# Patient Record
Sex: Female | Born: 1960
Health system: Southern US, Community
[De-identification: ages and names within clinical notes are randomized; demographics above are authoritative.]

## PROBLEM LIST (undated history)

## (undated) DIAGNOSIS — N809 Endometriosis, unspecified: Secondary | ICD-10-CM

## (undated) DIAGNOSIS — N870 Mild cervical dysplasia: Secondary | ICD-10-CM

## (undated) DIAGNOSIS — N841 Polyp of cervix uteri: Secondary | ICD-10-CM

## (undated) HISTORY — PX: HYSTEROSCOPY: SHX211

## (undated) HISTORY — DX: Polyp of cervix uteri: N84.1

## (undated) HISTORY — PX: FACIAL RECONSTRUCTION SURGERY: SHX631

## (undated) HISTORY — DX: Mild cervical dysplasia: N87.0

## (undated) HISTORY — DX: Endometriosis, unspecified: N80.9

## (undated) HISTORY — PX: TONSILLECTOMY: SUR1361

## (undated) HISTORY — PX: ROOT CANAL: SHX2363

## (undated) HISTORY — PX: COLPOSCOPY: SHX161

---

## 1993-10-08 DIAGNOSIS — N870 Mild cervical dysplasia: Secondary | ICD-10-CM

## 1993-10-08 HISTORY — DX: Mild cervical dysplasia: N87.0

## 1998-07-27 ENCOUNTER — Other Ambulatory Visit: Admission: RE | Admit: 1998-07-27 | Discharge: 1998-07-27 | Payer: Self-pay | Admitting: Obstetrics and Gynecology

## 1999-06-26 ENCOUNTER — Other Ambulatory Visit: Admission: RE | Admit: 1999-06-26 | Discharge: 1999-06-26 | Payer: Self-pay | Admitting: Obstetrics and Gynecology

## 2000-08-12 ENCOUNTER — Other Ambulatory Visit: Admission: RE | Admit: 2000-08-12 | Discharge: 2000-08-12 | Payer: Self-pay | Admitting: Obstetrics and Gynecology

## 2001-09-02 ENCOUNTER — Other Ambulatory Visit: Admission: RE | Admit: 2001-09-02 | Discharge: 2001-09-02 | Payer: Self-pay | Admitting: Obstetrics and Gynecology

## 2002-09-14 ENCOUNTER — Other Ambulatory Visit: Admission: RE | Admit: 2002-09-14 | Discharge: 2002-09-14 | Payer: Self-pay | Admitting: Obstetrics and Gynecology

## 2003-11-26 ENCOUNTER — Other Ambulatory Visit: Admission: RE | Admit: 2003-11-26 | Discharge: 2003-11-26 | Payer: Self-pay | Admitting: Obstetrics and Gynecology

## 2004-04-06 ENCOUNTER — Ambulatory Visit (HOSPITAL_COMMUNITY): Admission: RE | Admit: 2004-04-06 | Discharge: 2004-04-06 | Payer: Self-pay | Admitting: Obstetrics and Gynecology

## 2005-01-12 ENCOUNTER — Other Ambulatory Visit: Admission: RE | Admit: 2005-01-12 | Discharge: 2005-01-12 | Payer: Self-pay | Admitting: Obstetrics and Gynecology

## 2005-05-24 ENCOUNTER — Ambulatory Visit (HOSPITAL_COMMUNITY): Admission: RE | Admit: 2005-05-24 | Discharge: 2005-05-24 | Payer: Self-pay | Admitting: Obstetrics and Gynecology

## 2006-06-20 ENCOUNTER — Ambulatory Visit (HOSPITAL_COMMUNITY): Admission: RE | Admit: 2006-06-20 | Discharge: 2006-06-20 | Payer: Self-pay | Admitting: Obstetrics and Gynecology

## 2006-06-27 ENCOUNTER — Encounter: Admission: RE | Admit: 2006-06-27 | Discharge: 2006-06-27 | Payer: Self-pay | Admitting: Obstetrics and Gynecology

## 2006-07-26 ENCOUNTER — Other Ambulatory Visit: Admission: RE | Admit: 2006-07-26 | Discharge: 2006-07-26 | Payer: Self-pay | Admitting: Obstetrics and Gynecology

## 2007-07-08 ENCOUNTER — Ambulatory Visit (HOSPITAL_COMMUNITY): Admission: RE | Admit: 2007-07-08 | Discharge: 2007-07-08 | Payer: Self-pay | Admitting: Obstetrics and Gynecology

## 2007-09-22 ENCOUNTER — Other Ambulatory Visit: Admission: RE | Admit: 2007-09-22 | Discharge: 2007-09-22 | Payer: Self-pay | Admitting: Obstetrics and Gynecology

## 2008-10-06 ENCOUNTER — Ambulatory Visit (HOSPITAL_COMMUNITY): Admission: RE | Admit: 2008-10-06 | Discharge: 2008-10-06 | Payer: Self-pay | Admitting: Obstetrics and Gynecology

## 2008-10-22 ENCOUNTER — Other Ambulatory Visit: Admission: RE | Admit: 2008-10-22 | Discharge: 2008-10-22 | Payer: Self-pay | Admitting: Obstetrics and Gynecology

## 2009-10-08 DIAGNOSIS — N800 Endometriosis of uterus: Secondary | ICD-10-CM

## 2009-10-08 DIAGNOSIS — N8003 Adenomyosis of the uterus: Secondary | ICD-10-CM

## 2009-10-08 HISTORY — DX: Endometriosis of uterus: N80.0

## 2009-10-08 HISTORY — DX: Adenomyosis of the uterus: N80.03

## 2009-11-01 ENCOUNTER — Ambulatory Visit (HOSPITAL_COMMUNITY): Admission: RE | Admit: 2009-11-01 | Discharge: 2009-11-01 | Payer: Self-pay | Admitting: Obstetrics and Gynecology

## 2010-11-07 ENCOUNTER — Ambulatory Visit (HOSPITAL_COMMUNITY)
Admission: RE | Admit: 2010-11-07 | Discharge: 2010-11-07 | Payer: Self-pay | Source: Home / Self Care | Attending: Obstetrics and Gynecology | Admitting: Obstetrics and Gynecology

## 2012-02-12 ENCOUNTER — Other Ambulatory Visit: Payer: Self-pay | Admitting: Obstetrics and Gynecology

## 2012-02-12 DIAGNOSIS — Z1231 Encounter for screening mammogram for malignant neoplasm of breast: Secondary | ICD-10-CM

## 2012-03-07 ENCOUNTER — Ambulatory Visit (HOSPITAL_COMMUNITY)
Admission: RE | Admit: 2012-03-07 | Discharge: 2012-03-07 | Disposition: A | Discharge status: Home / Self Care | Payer: BC Managed Care – PPO | Source: Ambulatory Visit | Attending: Obstetrics and Gynecology | Admitting: Obstetrics and Gynecology

## 2012-03-07 DIAGNOSIS — Z1231 Encounter for screening mammogram for malignant neoplasm of breast: Secondary | ICD-10-CM

## 2013-02-10 ENCOUNTER — Encounter: Payer: Self-pay | Admitting: Certified Nurse Midwife

## 2013-02-12 ENCOUNTER — Ambulatory Visit (INDEPENDENT_AMBULATORY_CARE_PROVIDER_SITE_OTHER): Payer: BC Managed Care – PPO | Admitting: Certified Nurse Midwife

## 2013-02-12 ENCOUNTER — Encounter: Payer: Self-pay | Admitting: Certified Nurse Midwife

## 2013-02-12 VITALS — BP 110/64 | Ht 63.25 in | Wt 143.0 lb

## 2013-02-12 DIAGNOSIS — Z01419 Encounter for gynecological examination (general) (routine) without abnormal findings: Secondary | ICD-10-CM

## 2013-02-12 DIAGNOSIS — N951 Menopausal and female climacteric states: Secondary | ICD-10-CM

## 2013-02-12 DIAGNOSIS — Z0000 Encounter for general adult medical examination without abnormal findings: Secondary | ICD-10-CM

## 2013-02-12 LAB — POCT URINALYSIS DIPSTICK
Bilirubin, UA: NEGATIVE
Blood, UA: NEGATIVE
Glucose, UA: NEGATIVE
Nitrite, UA: NEGATIVE
Urobilinogen, UA: NEGATIVE
pH, UA: 5

## 2013-02-12 NOTE — Patient Instructions (Signed)

## 2013-02-12 NOTE — Progress Notes (Signed)
52 y.o. G0P0 Married Caucasian Fe here for annual exam. Perimenopausal with regular periods August, September, and October, after Provera use until stopping in October 2013, given Provera again in 1/14 with no withdrawal bleeding. No spotting or bleeding since that time.Denies any  vaginal dryness. Uses urgent care for other health issues.  No health issues today.   Patient's last menstrual period was 07/08/2012.          Sexually active: yes  The current method of family planning is condoms sometimes.    Exercising: yes  walking, weights & zumba Smoker:  no  Health Maintenance: Pap:  02-11-12 neg HPV HR neg MMG:  03-07-12 normal Colonoscopy:  none BMD:   none TDaP:  2010-pt had allergy to it Labs: poct urine-neg, hgb-13.4   reports that she has never smoked. She has never used smokeless tobacco. She reports that she does not drink alcohol or use illicit drugs.  Past Medical History  Diagnosis Date  . Adenomyosis 1/11  . Dysplasia of cervix, low grade (CIN 1) 1995  . Cervical polyp     No past surgical history on file.  Current Outpatient Prescriptions  Medication Sig Dispense Refill  . BIOTIN PO Take by mouth daily.      Marland Kitchen CALCIUM PO Take by mouth daily.      . Cholecalciferol (VITAMIN D PO) Take by mouth daily.       No current facility-administered medications for this visit.    Family History  Problem Relation Age of Onset  . Diabetes Mother   . Heart disease Mother   . Hypertension Mother   . Heart disease Father   . Mental illness Brother     ROS:  Pertinent items are noted in HPI.  Otherwise, a comprehensive ROS was negative.  Exam:   Ht 5' 3.25" (1.607 m)  Wt 143 lb (64.864 kg)  BMI 25.12 kg/m2  LMP 07/08/2012 Height: 5' 3.25" (160.7 cm)  Ht Readings from Last 3 Encounters:  02/12/13 5' 3.25" (1.607 m)    General appearance: alert, cooperative and appears stated age Head: Normocephalic, without obvious abnormality, atraumatic Neck: no adenopathy, supple,  symmetrical, trachea midline and thyroid normal to inspection and palpation Lungs: clear to auscultation bilaterally Breasts: normal appearance, no masses or tenderness, No nipple discharge or bleeding Heart: regular rate and rhythm Abdomen: soft, non-tender; no masses,  no organomegaly Extremities: extremities normal, atraumatic, no cyanosis or edema Skin: Skin color, texture, turgor normal. No rashes or lesions Lymph nodes: Cervical, supraclavicular, and axillary nodes normal. No abnormal inguinal nodes palpated Neurologic: Grossly normal   Pelvic: External genitalia:  no lesions              Urethra:  normal appearing urethra with no masses, tenderness or lesions              Bartholin's and Skene's: normal                 Vagina: normal appearing vagina with normal color and discharge, no lesions              Cervix: normal non tender, small polyp noted in cervical canal              Pap taken: yes Bimanual Exam:  Uterus:  enlarged, 12 weeks size known enlargement with adenomyosis  No size change              Adnexa: normal adnexa and no mass, fullness, tenderness  Rectovaginal: Confirms               Anus:  normal sphincter tone, no lesions  A:  Well Woman with normal exam  P:   Pap smear as per guidelines   mammogram pap smear return annually or prn  An After Visit Summary was printed and given to the patient.  Reviewed, TL

## 2013-02-13 LAB — FOLLICLE STIMULATING HORMONE: FSH: 37.9 m[IU]/mL

## 2013-04-28 ENCOUNTER — Other Ambulatory Visit: Payer: Self-pay | Admitting: Certified Nurse Midwife

## 2013-04-28 DIAGNOSIS — Z1231 Encounter for screening mammogram for malignant neoplasm of breast: Secondary | ICD-10-CM

## 2013-04-30 ENCOUNTER — Ambulatory Visit (HOSPITAL_COMMUNITY)
Admission: RE | Admit: 2013-04-30 | Discharge: 2013-04-30 | Disposition: A | Discharge status: Home / Self Care | Payer: BC Managed Care – PPO | Source: Ambulatory Visit | Attending: Certified Nurse Midwife | Admitting: Certified Nurse Midwife

## 2013-04-30 DIAGNOSIS — Z1231 Encounter for screening mammogram for malignant neoplasm of breast: Secondary | ICD-10-CM | POA: Insufficient documentation

## 2013-10-22 ENCOUNTER — Telehealth: Payer: Self-pay | Admitting: Certified Nurse Midwife

## 2013-10-22 NOTE — Telephone Encounter (Signed)
Patient is calling to update debbi on her menstrual calendar Patient had cycle on aug and sep 2014 but has not had one since

## 2013-10-23 NOTE — Telephone Encounter (Signed)
Routing to Deborah S. Leonard CNM for review. 

## 2013-10-26 NOTE — Telephone Encounter (Signed)
Patient needs OV.

## 2013-10-27 NOTE — Telephone Encounter (Signed)
Spoke with pt to schedule OV to assess her absence of menses for nearly 4 months. Sched appt tomorrow at 1 pm with DL.

## 2013-10-28 ENCOUNTER — Encounter: Payer: Self-pay | Admitting: Certified Nurse Midwife

## 2013-10-28 ENCOUNTER — Ambulatory Visit (INDEPENDENT_AMBULATORY_CARE_PROVIDER_SITE_OTHER): Payer: Managed Care, Other (non HMO) | Admitting: Certified Nurse Midwife

## 2013-10-28 VITALS — BP 118/80 | HR 69 | Resp 16 | Ht 63.25 in | Wt 144.0 lb

## 2013-10-28 DIAGNOSIS — N912 Amenorrhea, unspecified: Secondary | ICD-10-CM

## 2013-10-28 DIAGNOSIS — N951 Menopausal and female climacteric states: Secondary | ICD-10-CM

## 2013-10-28 LAB — POCT URINE PREGNANCY: Preg Test, Ur: NEGATIVE

## 2013-10-28 NOTE — Progress Notes (Signed)
  53 y.o.Married Caucasian female presents with no menses since 9/13. Periods prior to that were irregular, patient has taken Provera before for with no results in 2013.Patient complaining of hot flashes/ night sweats that have increased. Occasional insomnia, no issues. Contraception is consistent condom use. No sexual activity in at least a month. Patient has history of enlarged uterus. No other health issues.  O: Healthy female, WDWN Affect:normal, orientation x 3     Pertinent items are noted in HPI. Abdomen: normal, non tender Pelvic: cervix normal in appearance, external genitalia normal, no adnexal masses or tenderness, no cervical motion tenderness, vagina normal without discharge and uterus enlarged 12 week size, no change from previous exam 5/14., non tender  Assessment: Perimenopausal with amenorrhea with negative UPT Enlarged uterus no change due to adenomyosis  Plan: Discussed with patient factors that may be contributory to menstrual abnormalities include thyroid, pituitary, and  hormonal related to menopause changes. Patient given menses calendar to continue to record. Continue condom use..  Labs:FSH,TSH,Prolactin Discussed Provera use again after review of labs. Questions addressed.      RV prn.

## 2013-10-29 ENCOUNTER — Other Ambulatory Visit: Payer: Self-pay | Admitting: Certified Nurse Midwife

## 2013-10-29 DIAGNOSIS — N951 Menopausal and female climacteric states: Secondary | ICD-10-CM

## 2013-10-29 DIAGNOSIS — N912 Amenorrhea, unspecified: Secondary | ICD-10-CM

## 2013-10-29 LAB — FOLLICLE STIMULATING HORMONE: FSH: 93.6 m[IU]/mL

## 2013-10-29 LAB — PROLACTIN: Prolactin: 6.3 ng/mL

## 2013-10-29 LAB — TSH: TSH: 1.072 u[IU]/mL (ref 0.350–4.500)

## 2013-10-29 LAB — HCG, SERUM, QUALITATIVE: PREG SERUM: NEGATIVE

## 2013-10-29 MED ORDER — MEDROXYPROGESTERONE ACETATE 10 MG PO TABS: 10.0000 mg | ORAL_TABLET | Freq: Every day | ORAL | Status: DC

## 2013-11-04 NOTE — Progress Notes (Signed)
Reviewed personally.  M. Suzanne Jeryn Cerney, MD.  

## 2013-11-16 ENCOUNTER — Telehealth: Payer: Self-pay | Admitting: Certified Nurse Midwife

## 2013-11-16 ENCOUNTER — Other Ambulatory Visit: Payer: Self-pay | Admitting: Orthopedic Surgery

## 2013-11-16 NOTE — Telephone Encounter (Signed)
It may take up to 14 days to have response to Provera. Not a concern at this point. Please advise if  Bleeding does occur

## 2013-11-16 NOTE — Telephone Encounter (Signed)
Spoke with pt who took 10 days of provera and has been off for 8 days with no bleed so far. Advised that sometimes it can take longer for a bleed, but that I would route info to DL. Pt appreciative.

## 2013-11-16 NOTE — Telephone Encounter (Signed)
Pt has been on the premarin for 10 days and off for 10. Still no period.

## 2013-11-17 NOTE — Telephone Encounter (Signed)
LM on pt's VM confirming name that it may be 14 days before bleed will occur. Pt to call back after she has been off provera 14 days for an update.

## 2014-02-15 ENCOUNTER — Encounter: Payer: Self-pay | Admitting: Certified Nurse Midwife

## 2014-02-15 ENCOUNTER — Ambulatory Visit (INDEPENDENT_AMBULATORY_CARE_PROVIDER_SITE_OTHER): Payer: Managed Care, Other (non HMO) | Admitting: Certified Nurse Midwife

## 2014-02-15 VITALS — BP 118/80 | HR 66 | Temp 97.6°F | Ht 63.5 in | Wt 148.0 lb

## 2014-02-15 DIAGNOSIS — N951 Menopausal and female climacteric states: Secondary | ICD-10-CM

## 2014-02-15 DIAGNOSIS — Z0000 Encounter for general adult medical examination without abnormal findings: Secondary | ICD-10-CM

## 2014-02-15 DIAGNOSIS — Z1211 Encounter for screening for malignant neoplasm of colon: Secondary | ICD-10-CM

## 2014-02-15 DIAGNOSIS — Z01419 Encounter for gynecological examination (general) (routine) without abnormal findings: Secondary | ICD-10-CM

## 2014-02-15 LAB — POCT URINALYSIS DIPSTICK
BILIRUBIN UA: NEGATIVE
Glucose, UA: NEGATIVE
KETONES UA: NEGATIVE
Leukocytes, UA: NEGATIVE
Nitrite, UA: NEGATIVE
PH UA: 5
PROTEIN UA: NEGATIVE
RBC UA: NEGATIVE
Urobilinogen, UA: NEGATIVE

## 2014-02-15 LAB — HEMOGLOBIN, FINGERSTICK: Hemoglobin, fingerstick: 13.1 g/dL (ref 12.0–16.0)

## 2014-02-15 NOTE — Patient Instructions (Signed)

## 2014-02-15 NOTE — Progress Notes (Signed)
53 y.o. G0P0000 Married Caucasian Fe here for annual exam. Periods have been irregular over the past year with last two uses of Provera no withdrawal bleeding. Last dose 10/24/13. Occasional night sweats, no insomnia. No hot flashes. Slight vaginal dryness. Contraception working well. Sees PCP prn. No other health issues today. Has regular eye and dental exams.   Patient's last menstrual period was 07/08/2013.          Sexually active: yes  The current method of family planning is condoms most of the time.    Exercising: yes  walking & eliptical Smoker:  no  Health Maintenance: Pap: 02-12-13 neg HPV HR neg MMG: 04-30-13 normal Colonoscopy: none BMD:   none TDaP:  2010 Labs: Poct urine-neg, Hgb-13.1 Self breast exam: done occ   reports that she has never smoked. She has never used smokeless tobacco. She reports that she does not drink alcohol or use illicit drugs.  Past Medical History  Diagnosis Date  . Adenomyosis 1/11  . Dysplasia of cervix, low grade (CIN 1) 1995  . Cervical polyp     Past Surgical History  Procedure Laterality Date  . Colposcopy    . Tonsillectomy    . Facial reconstruction surgery      Current Outpatient Prescriptions  Medication Sig Dispense Refill  . BIOTIN PO Take by mouth daily.      Marland Kitchen CALCIUM PO Take by mouth as needed.       . Cholecalciferol (VITAMIN D PO) Take by mouth as needed.      . Multiple Vitamins-Minerals (MULTIVITAMIN PO) Take by mouth daily.      . Omega-3 Fatty Acids (FISH OIL PO) Take by mouth as needed.       No current facility-administered medications for this visit.    Family History  Problem Relation Age of Onset  . Diabetes Mother   . Heart disease Mother   . Hypertension Mother   . Heart disease Father     Psychologist, forensic  . Mental illness Brother     ROS:  Pertinent items are noted in HPI.  Otherwise, a comprehensive ROS was negative.  Exam:   BP 118/80  Pulse 66  Temp(Src) 97.6 F (36.4 C) (Oral)  Ht 5' 3.5"  (1.613 m)  Wt 148 lb (67.132 kg)  BMI 25.80 kg/m2  LMP 07/08/2013 Height: 5' 3.5" (161.3 cm)  Ht Readings from Last 3 Encounters:  02/15/14 5' 3.5" (1.613 m)  10/28/13 5' 3.25" (1.607 m)  02/12/13 5' 3.25" (1.607 m)    General appearance: alert, cooperative and appears stated age Head: Normocephalic, without obvious abnormality, atraumatic Neck: no adenopathy, supple, symmetrical, trachea midline and thyroid normal to inspection and palpation and non-palpable Lungs: clear to auscultation bilaterally Breasts: normal appearance, no masses or tenderness, No nipple retraction or dimpling, No nipple discharge or bleeding, No axillary or supraclavicular adenopathy Heart: regular rate and rhythm Abdomen: soft, non-tender; no masses,  no organomegaly Extremities: extremities normal, atraumatic, no cyanosis or edema Skin: Skin color, texture, turgor normal. No rashes or lesions Lymph nodes: Cervical, supraclavicular, and axillary nodes normal. No abnormal inguinal nodes palpated Neurologic: Grossly normal   Pelvic: External genitalia:  no lesions              Urethra:  normal appearing urethra with no masses, tenderness or lesions              Bartholin's and Skene's: normal  Vagina: normal appearing vagina with normal color and discharge, no lesions              Cervix: normal, non tender, no polyp seen              Pap taken: no Bimanual Exam:  Uterus:  normal size, contour, position, consistency, mobility, non-tender and Mid position              Adnexa: normal adnexa and no mass, fullness, tenderness               Rectovaginal: Confirms               Anus:  normal sphincter tone, no lesions  A:  Well Woman with normal exam  Perimenopausal with amenorrhea  Slight vaginal dryness  Colonoscopy due  P:   Reviewed health and wellness pertinent to exam  Patient to continue menses calendar and if no menses by 10/15 will be menopausal. Will notify if any vaginal bleeding.  Discussed HRT benefits/ risks, patient prefers none unless status changes.  Lab: FSH,Vitamin D  Discussed risks and benefits of colonoscopy, declines scheduling today. IFOB dispensed.  Discussed OTC products for vaginal dryness and Olive oil use. Patient will advise if problems with.  Pap smear not taken today  Mammogram yearly Discussed importance of exercise, good diet, calcium in diet, and menopause.  return annually or prn  An After Visit Summary was printed and given to the patient.

## 2014-02-16 ENCOUNTER — Telehealth: Payer: Self-pay

## 2014-02-16 LAB — VITAMIN D 25 HYDROXY (VIT D DEFICIENCY, FRACTURES): VIT D 25 HYDROXY: 41 ng/mL (ref 30–89)

## 2014-02-16 LAB — FOLLICLE STIMULATING HORMONE: FSH: 89 m[IU]/mL

## 2014-02-16 NOTE — Telephone Encounter (Signed)
[email or web address]

## 2014-02-16 NOTE — Telephone Encounter (Signed)
Message copied by Susy Manor on Tue Feb 16, 2014 10:31 AM ------      Message from: Regina Eck      Created: Tue Feb 16, 2014  8:35 AM       Notify patient that [facility] shows menopausal. If she has any bleeding she needs to notify us.      Vitamin D per protocol ------

## 2014-02-17 NOTE — Telephone Encounter (Signed)
Patient notified of results. See lab 

## 2014-02-18 NOTE — Progress Notes (Signed)
Reviewed personally.  M. Suzanne Shaul Trautman, MD.  

## 2014-05-04 ENCOUNTER — Other Ambulatory Visit: Payer: Self-pay | Admitting: Certified Nurse Midwife

## 2014-05-04 DIAGNOSIS — Z1231 Encounter for screening mammogram for malignant neoplasm of breast: Secondary | ICD-10-CM

## 2014-05-27 ENCOUNTER — Ambulatory Visit (HOSPITAL_COMMUNITY)
Admission: RE | Admit: 2014-05-27 | Discharge: 2014-05-27 | Disposition: A | Discharge status: Home / Self Care | Payer: Managed Care, Other (non HMO) | Source: Ambulatory Visit | Attending: Certified Nurse Midwife | Admitting: Certified Nurse Midwife

## 2014-05-27 DIAGNOSIS — Z1231 Encounter for screening mammogram for malignant neoplasm of breast: Secondary | ICD-10-CM | POA: Diagnosis present

## 2014-12-16 DIAGNOSIS — M25532 Pain in left wrist: Secondary | ICD-10-CM | POA: Insufficient documentation

## 2015-02-17 ENCOUNTER — Ambulatory Visit: Payer: Managed Care, Other (non HMO) | Admitting: Certified Nurse Midwife

## 2015-02-18 ENCOUNTER — Encounter: Payer: Self-pay | Admitting: Certified Nurse Midwife

## 2015-02-18 ENCOUNTER — Ambulatory Visit (INDEPENDENT_AMBULATORY_CARE_PROVIDER_SITE_OTHER): Payer: Managed Care, Other (non HMO) | Admitting: Certified Nurse Midwife

## 2015-02-18 VITALS — BP 100/64 | HR 68 | Resp 16 | Ht 63.5 in | Wt 148.0 lb

## 2015-02-18 DIAGNOSIS — Z0000 Encounter for general adult medical examination without abnormal findings: Secondary | ICD-10-CM

## 2015-02-18 DIAGNOSIS — Z124 Encounter for screening for malignant neoplasm of cervix: Secondary | ICD-10-CM | POA: Diagnosis not present

## 2015-02-18 DIAGNOSIS — N950 Postmenopausal bleeding: Secondary | ICD-10-CM | POA: Diagnosis not present

## 2015-02-18 DIAGNOSIS — Z01419 Encounter for gynecological examination (general) (routine) without abnormal findings: Secondary | ICD-10-CM | POA: Diagnosis not present

## 2015-02-18 DIAGNOSIS — N852 Hypertrophy of uterus: Secondary | ICD-10-CM | POA: Diagnosis not present

## 2015-02-18 LAB — POCT URINALYSIS DIPSTICK
BILIRUBIN UA: NEGATIVE
Blood, UA: NEGATIVE
GLUCOSE UA: NEGATIVE
KETONES UA: NEGATIVE
LEUKOCYTES UA: NEGATIVE
Nitrite, UA: NEGATIVE
Protein, UA: NEGATIVE
UROBILINOGEN UA: NEGATIVE
pH, UA: 5

## 2015-02-18 LAB — COMPREHENSIVE METABOLIC PANEL
ALK PHOS: 56 U/L (ref 39–117)
ALT: 19 U/L (ref 0–35)
AST: 20 U/L (ref 0–37)
Albumin: 4.1 g/dL (ref 3.5–5.2)
BUN: 12 mg/dL (ref 6–23)
CALCIUM: 9 mg/dL (ref 8.4–10.5)
CO2: 27 meq/L (ref 19–32)
Chloride: 104 mEq/L (ref 96–112)
Creat: 0.8 mg/dL (ref 0.50–1.10)
Glucose, Bld: 92 mg/dL (ref 70–99)
Potassium: 4.4 mEq/L (ref 3.5–5.3)
SODIUM: 141 meq/L (ref 135–145)
Total Bilirubin: 0.5 mg/dL (ref 0.2–1.2)
Total Protein: 7.4 g/dL (ref 6.0–8.3)

## 2015-02-18 LAB — TSH: TSH: 1.858 u[IU]/mL (ref 0.350–4.500)

## 2015-02-18 LAB — HEMOGLOBIN, FINGERSTICK: Hemoglobin, fingerstick: 13.4 g/dL (ref 12.0–16.0)

## 2015-02-18 LAB — LIPID PANEL
CHOLESTEROL: 191 mg/dL (ref 0–200)
HDL: 53 mg/dL (ref >=46)
LDL CALC: 116 mg/dL — AB (ref 0–99)
Total CHOL/HDL Ratio: 3.6 Ratio
Triglycerides: 110 mg/dL (ref <150)
VLDL: 22 mg/dL (ref 0–40)

## 2015-02-18 NOTE — Progress Notes (Signed)
54 y.o. G0P0000 Married  Caucasian Fe here for annual exam. Patient had bleeding in 01/22/15 for 4-5 days with sore breasts with red blood small then progressed to light and with cramping. Last period 10/14.  [facility] 02/15/14. Patient having slight cramping today, no bleeding. Patient having allergies, but no other concerns. Denies vaginal dryness. Would like screening labs today. Sees PCP prn. No other health issues today.  Patient's last menstrual period was 01/22/2015.          Sexually active: Yes.    The current method of family planning is condoms sometimes.    Exercising: Yes.    walking Smoker:  no  Health Maintenance: Pap: 02-12-13 neg HPV HR neg MMG: 05-27-14 category c density,birads 1:neg Colonoscopy:  none BMD:   none TDaP:  2010 Labs: Poct urine-neg, Hgb-13.4 Self breast exam:done occ   reports that she has never smoked. She has never used smokeless tobacco. She reports that she does not drink alcohol or use illicit drugs.  Past Medical History  Diagnosis Date  . Adenomyosis 1/11  . Dysplasia of cervix, low grade (CIN 1) 1995  . Cervical polyp     Past Surgical History  Procedure Laterality Date  . Colposcopy    . Tonsillectomy    . Facial reconstruction surgery      Current Outpatient Prescriptions  Medication Sig Dispense Refill  . CALCIUM PO Take by mouth as needed.     . Cholecalciferol (VITAMIN D PO) Take by mouth as needed.    . Multiple Vitamins-Minerals (MULTIVITAMIN PO) Take by mouth daily.    . Omega-3 Fatty Acids (FISH OIL PO) Take by mouth as needed.     No current facility-administered medications for this visit.    Family History  Problem Relation Age of Onset  . Diabetes Mother   . Heart disease Mother   . Hypertension Mother   . Heart disease Father     Psychologist, forensic  . Mental illness Brother     ROS:  Pertinent items are noted in HPI.  Otherwise, a comprehensive ROS was negative.  Exam:   BP 100/64 mmHg  Pulse 68  Resp 16  Ht 5' 3.5" (1.613  m)  Wt 148 lb (67.132 kg)  BMI 25.80 kg/m2  LMP 01/22/2015 Height: 5' 3.5" (161.3 cm) Ht Readings from Last 3 Encounters:  02/18/15 5' 3.5" (1.613 m)  02/15/14 5' 3.5" (1.613 m)  10/28/13 5' 3.25" (1.607 m)    General appearance: alert, cooperative and appears stated age Head: Normocephalic, without obvious abnormality, atraumatic Neck: no adenopathy, supple, symmetrical, trachea midline and thyroid normal to inspection and palpation Lungs: clear to auscultation bilaterally Breasts: normal appearance, no masses or tenderness, No nipple retraction or dimpling, No nipple discharge or bleeding, No axillary or supraclavicular adenopathy Heart: regular rate and rhythm Abdomen: soft, non-tender; no masses,  no organomegaly Extremities: extremities normal, atraumatic, no cyanosis or edema Skin: Skin color, texture, turgor normal. No rashes or lesions Lymph nodes: Cervical, supraclavicular, and axillary nodes normal. No abnormal inguinal nodes palpated Neurologic: Grossly normal   Pelvic: External genitalia:  no lesions              Urethra:  normal appearing urethra with no masses, tenderness or lesions              Bartholin's and Skene's: normal                 Vagina: normal appearing vagina with normal color and discharge, no  lesions              Cervix: normal, non tender, no lesions              Pap taken: Yes.   Bimanual Exam:  Uterus:  enlarged, 14-16 weeks size slightly firm feel, not nodular              Adnexa: normal adnexa and no mass, fullness, tenderness               Rectovaginal: Confirms               Anus:  normal sphincter tone, no lesions  Chaperone present: Yes  A:  Well Woman with normal exam  Postmenopausal with PMB x 2 FSH in 10/29/13 93.6  History of adenomyosis with enlarged uterus in 10/28/13 12 week no change from 5/14. LMP prior to PMB 07/08/13. Provera challenge with no bleeding 10/28/13  Enlarged uterus size change  Screening labs  P:   Reviewed health  and wellness pertinent to exam  Discussed concerns with PMB and need for evaluation. Patient agreeable to PUS and endometrial biopsy. Will keep record of bleeding, warning signs of bleeding given.  Discussed enlarged uterus was known in past with note in chart feels like there is a size change which will be evaluated. Patient will be called with insurance information and scheduled.  Screening labs: Lipid panel, CMP, Vitamin D, TSH, FSH  Pap smear taken today with HPVHR   counseled on mammography screening, menopause, adequate intake of calcium and vitamin D, diet and exercise  return annually or prn  An After Visit Summary was printed and given to the patient.

## 2015-02-18 NOTE — Patient Instructions (Signed)

## 2015-02-19 LAB — FOLLICLE STIMULATING HORMONE: FSH: 55.7 m[IU]/mL

## 2015-02-19 LAB — VITAMIN D 25 HYDROXY (VIT D DEFICIENCY, FRACTURES): Vit D, 25-Hydroxy: 32 ng/mL (ref 30–100)

## 2015-02-20 NOTE — Progress Notes (Signed)
Reviewed personally.  M. Suzanne Shraddha Lebron, MD.  

## 2015-02-21 ENCOUNTER — Telehealth: Payer: Self-pay

## 2015-02-21 NOTE — Telephone Encounter (Signed)
-----   Message from Regina Eck, CNM sent at 02/21/2015  8:00 AM EDT ----- Notify patient that TSH normal Lipid panel optimal level Vitamin D normal Liver, kidney and glucose normal FSH still showing menopausal She will be called for scheduling of PUS and endo  bx

## 2015-02-21 NOTE — Telephone Encounter (Signed)
Return call to Joy. °

## 2015-02-21 NOTE — Telephone Encounter (Signed)
[email or web address]

## 2015-02-21 NOTE — Telephone Encounter (Signed)
Patient notified of results as written by provider 

## 2015-02-22 ENCOUNTER — Telehealth: Payer: Self-pay | Admitting: Certified Nurse Midwife

## 2015-02-22 DIAGNOSIS — N950 Postmenopausal bleeding: Secondary | ICD-10-CM

## 2015-02-22 NOTE — Telephone Encounter (Signed)
Call to patient. Advised of benefit quote received for PUS/EMB. Patient agreeable. Scheduled procedures. Advised patient of 72 hour cancellation policy and $672 cancellation fee. Patient agreeable.

## 2015-02-23 LAB — IPS PAP TEST WITH HPV

## 2015-03-02 NOTE — Progress Notes (Signed)
Letter sent.

## 2015-03-03 ENCOUNTER — Ambulatory Visit (INDEPENDENT_AMBULATORY_CARE_PROVIDER_SITE_OTHER): Payer: Managed Care, Other (non HMO) | Admitting: Obstetrics & Gynecology

## 2015-03-03 ENCOUNTER — Other Ambulatory Visit: Payer: Managed Care, Other (non HMO) | Admitting: Obstetrics & Gynecology

## 2015-03-03 ENCOUNTER — Ambulatory Visit (INDEPENDENT_AMBULATORY_CARE_PROVIDER_SITE_OTHER): Payer: Managed Care, Other (non HMO)

## 2015-03-03 ENCOUNTER — Other Ambulatory Visit: Payer: Managed Care, Other (non HMO)

## 2015-03-03 DIAGNOSIS — N800 Endometriosis of uterus: Secondary | ICD-10-CM | POA: Diagnosis not present

## 2015-03-03 DIAGNOSIS — N852 Hypertrophy of uterus: Secondary | ICD-10-CM

## 2015-03-03 DIAGNOSIS — N950 Postmenopausal bleeding: Secondary | ICD-10-CM | POA: Diagnosis not present

## 2015-03-03 DIAGNOSIS — N8003 Adenomyosis of the uterus: Secondary | ICD-10-CM

## 2015-03-03 DIAGNOSIS — N809 Endometriosis, unspecified: Secondary | ICD-10-CM

## 2015-03-03 MED ORDER — DIAZEPAM 5 MG PO TABS: 5.0000 mg | ORAL_TABLET | Freq: Four times a day (QID) | ORAL | Status: DC | PRN

## 2015-03-03 NOTE — Progress Notes (Signed)
54 y.o. Karina Gaines here for a pelvic ultrasound due to enlarged uterus noted on physical exam that seemed enlarged from prior visits.  Pt does have a hx of adenomyosis and known enlarged uterus.  As well, pt has had two episode of PMP bleeding.  Stopped cycling in 2014.  Last two bleeding episodes have been light and spotting only.  In April, pt reports having PMS symptoms as well including sore breasts.    Patient's last menstrual period was 01/22/2015.  Sexually active:  yes  Contraception: condoms, at times  FINDINGS: UTERUS: 10.6 x 8.7 x 6.9cm (smaller when compared to prior PUS done 1/11), findings consistent with adenomyosis EMS: 4.27mm ADNEXA:   Left ovary 1.8 x 1.3 x 1.0cm   Right ovary 2.1 x 1.6 x 1.0cm CUL DE SAC: no free fluid   Endometrial biopsy recommended.  Discussed with patient.  Verbal and written consent obtained.   Procedure:  Speculum placed.  Cervix visualization was difficult due to pt discomfort.  Very hard to tell is pt has atypical appearing posterior lip of cervix (which was beefy red in appearance) vs a large protruding endocervical polyp.  With manipulation of speculum, pt would tighten buttocks and displace speculum.  After several attempts and continued difficulty with visualization, procedure ended.  No biopsy was obtained.    Assessment:  Enlarged uterus with findings most consistent with adenomyosis.  (very similar appearance on PUS in 2011) PMP Possible protruding endocervical polyp  Plan: Pt is going to return for repeat exam after taking some Valium to help with relaxation.  5mg  tabs given (#10/0RF), one to be taken about 90 minutes before visit and pt is to bring a driver.  ~89 minutes spent with patient >50% of time was in face to face discussion of above.

## 2015-03-04 ENCOUNTER — Encounter: Payer: Self-pay | Admitting: Obstetrics & Gynecology

## 2015-03-04 DIAGNOSIS — N809 Endometriosis, unspecified: Secondary | ICD-10-CM

## 2015-03-04 DIAGNOSIS — N8003 Adenomyosis of the uterus: Secondary | ICD-10-CM | POA: Insufficient documentation

## 2015-03-04 DIAGNOSIS — N852 Hypertrophy of uterus: Secondary | ICD-10-CM | POA: Insufficient documentation

## 2015-03-15 ENCOUNTER — Ambulatory Visit (INDEPENDENT_AMBULATORY_CARE_PROVIDER_SITE_OTHER): Payer: Managed Care, Other (non HMO) | Admitting: Obstetrics & Gynecology

## 2015-03-15 ENCOUNTER — Encounter: Payer: Self-pay | Admitting: Obstetrics & Gynecology

## 2015-03-15 VITALS — BP 118/70 | HR 68 | Resp 14 | Wt 149.0 lb

## 2015-03-15 DIAGNOSIS — N950 Postmenopausal bleeding: Secondary | ICD-10-CM

## 2015-03-15 DIAGNOSIS — N841 Polyp of cervix uteri: Secondary | ICD-10-CM

## 2015-03-15 DIAGNOSIS — D251 Intramural leiomyoma of uterus: Secondary | ICD-10-CM

## 2015-03-23 DIAGNOSIS — D219 Benign neoplasm of connective and other soft tissue, unspecified: Secondary | ICD-10-CM | POA: Insufficient documentation

## 2015-03-23 DIAGNOSIS — N841 Polyp of cervix uteri: Secondary | ICD-10-CM | POA: Insufficient documentation

## 2015-03-23 NOTE — Progress Notes (Signed)
Subjective:     Patient ID: Karina Gaines, female   DOB: Jul 02, 1961, 54 y.o.   MRN: [id number]  HPI 54 yo G0 MWF here for possible endometrial biopsy.  Pt see 03/03/15 for PUS showing uterine fibroids.  Pt has had recent bleeding and has [place] level of 55 obtained 02/18/15.  At ultrasound, attempt made at performing and endometrial biopsy but pt was quite uncomfortable.  I never got a very good view of the cervix although I felt there was an endocervical polyp present.    As has taken a Valium about 90 minutes ago and is here for repeat examination and possible endometrial biopsy.  Review of Systems  All other systems reviewed and are negative.      Objective:   Physical Exam  Constitutional: She appears well-developed and well-nourished.  Genitourinary: Vagina normal. There is no rash, tenderness, lesion or injury on the right labia. There is no rash, tenderness, lesion or injury on the left labia. Cervix exhibits no motion tenderness, no discharge and no friability.  Large endocervical polyp noted prolapsed through cervix.  No biopsy performed.  Lymphadenopathy:       Right: No inguinal adenopathy present.       Left: No inguinal adenopathy present.  Skin: Skin is warm and dry.  Psychiatric: She has a normal mood and affect.   D/W pt finding.  Due to size, recommend removal in OR with hysteroscopy and D&C for endometrial sampling.  Pt and I briefly discussed procedure, risks and benefits.  Will have her scheduled and return for pre-op visit due to medication she took before coming today.  Do not feel I can have a reliable discussion regarding the procedure and risks and benefits given her sedation level.    Assessment:     PMP bleeding FSH 55 Uterine fibroids with decreased size in uterus over last  5 years on PUS Large endocervical poly    Plan:     Cervical polyp resection with hysteroscopy and D&C will be planned.  Will have pt return for pre-op visit due to taking Valium before  coming today.  ~15 minutes spent with patient >50% of time was in face to face discussion of above.

## 2015-03-24 ENCOUNTER — Telehealth: Payer: Self-pay | Admitting: *Deleted

## 2015-03-24 NOTE — Telephone Encounter (Signed)
Call to patient regarding surgery date preferences. Left message to call back.

## 2015-03-28 NOTE — Telephone Encounter (Signed)
Returning a call to sally.

## 2015-03-30 NOTE — Telephone Encounter (Signed)
Call to patient to check date preferences for surgery. Date options and scheduling policy discussed.  Patient waiting to hear from business office and will let me know her date preference.

## 2015-03-31 NOTE — Telephone Encounter (Signed)
Patient returning call.

## 2015-04-01 NOTE — Telephone Encounter (Signed)
Return call to patient. Several questions regarding length of time procedure takes, type of anesthesia, how long she will be NPO. Questions answered and patient thinks she is ready to schedule for early August.  Patient will come to office on Monday 04-04-15 to finalize arrangements and ask any further questions. Patient also had questions about hospital bill. Given number for [place]. Patient aware this will be estimate and that coding may change depending on procedure.  Routing to provider for final review. Patient agreeable to disposition. Will close encounter.

## 2015-04-04 ENCOUNTER — Telehealth: Payer: Self-pay | Admitting: *Deleted

## 2015-04-04 NOTE — Telephone Encounter (Signed)
Patient came to office to discuss surgery date options and review instructions.  Has decided to proceed with 05-09-15. Surgery scheduled for 05-09-15 ay 1100 at [place] to arrive at 0930 unless otherwise directed by hospital staff. Surgery instruction sheet reviewed and printed copy given to patient. See scanned copy. Patient also spoke to [place] in business office.  Routing to provider for final review. Patient agreeable to disposition. Will close encounter.

## 2015-04-12 ENCOUNTER — Encounter (HOSPITAL_COMMUNITY): Payer: Self-pay | Admitting: *Deleted

## 2015-04-12 ENCOUNTER — Other Ambulatory Visit: Payer: Self-pay | Admitting: Obstetrics & Gynecology

## 2015-04-12 NOTE — Telephone Encounter (Signed)
Orders placed for surgery.

## 2015-04-18 ENCOUNTER — Ambulatory Visit (INDEPENDENT_AMBULATORY_CARE_PROVIDER_SITE_OTHER): Payer: Managed Care, Other (non HMO) | Admitting: Obstetrics & Gynecology

## 2015-04-18 VITALS — BP 130/72 | HR 60 | Resp 16 | Wt 146.0 lb

## 2015-04-18 DIAGNOSIS — N852 Hypertrophy of uterus: Secondary | ICD-10-CM

## 2015-04-18 DIAGNOSIS — N841 Polyp of cervix uteri: Secondary | ICD-10-CM | POA: Diagnosis not present

## 2015-04-18 MED ORDER — IBUPROFEN 800 MG PO TABS: 800.0000 mg | ORAL_TABLET | Freq: Three times a day (TID) | ORAL | Status: DC | PRN

## 2015-04-18 MED ORDER — HYDROCODONE-ACETAMINOPHEN 5-325 MG PO TABS: 1.0000 | ORAL_TABLET | Freq: Four times a day (QID) | ORAL | Status: DC | PRN

## 2015-04-18 NOTE — Progress Notes (Signed)
54 y.o. G18P0000 Married Caucasian female here for discussion of upcoming procedure.  Hysteroscopy with possible endometrial polyp removal, D&C, and endocervical polyp removal planned due to PMP bleeding.    Ultrasound performed 03/03/15 showing: UTERUS: 10.6 x 8.7 x 6.9cm (smaller when compared to prior PUS done 1/11), findings consistent with adenomyosis  EMS: 4.49mm  ADNEXA: Left ovary 1.8 x 1.3 x 1.0cm  Right ovary 2.1 x 1.6 x 1.0cm  CUL DE SAC: no free fluid  No biopsy has been done as surgical procedure recommended due to size of polyp.  No other alternative to procedure exists except doing in-office and pt required valium for last exam so I feel that is not a good idea.  Procedure discussed with patient.  Recovery and pain management discussed.  Risks discussed including but not limited to bleeding, rare risk of transfusion, infection, 1% risk of uterine perforation with risks of fluid deficit causing cardiac arrythmia, cerebral swelling and/or need to stop procedure early.  Fluid emboli and rare risk of death discussed.  DVT/PE, rare risk of risk of bowel/bladder/ureteral/vascular injury.  Patient aware if pathology abnormal she may need additional treatment.  All questions answered.   Ob Hx:   No LMP recorded. Patient is not currently having periods (Reason: Perimenopausal).          Sexually active: Yes.   Birth control: PMP status Last pap: 02/18/15 Last MMG: 05/27/14 Tobacco: no Blood transfusion:  Of autologous blood  Past Surgical History  Procedure Laterality Date  . Colposcopy    . Tonsillectomy    . Facial reconstruction surgery      Past Medical History  Diagnosis Date  . Adenomyosis 1/11  . Dysplasia of cervix, low grade (CIN 1) 1995  . Cervical polyp     Allergies: Sulfa antibiotics; Latex; and Tdap  Current Outpatient Prescriptions  Medication Sig Dispense Refill  . CALCIUM PO Take by mouth as needed.     . Cholecalciferol (VITAMIN D PO) Take by mouth as  needed.    . diazepam (VALIUM) 5 MG tablet Take 1 tablet (5 mg total) by mouth every 6 (six) hours as needed for anxiety. 10 tablet 0  . Multiple Vitamins-Minerals (MULTIVITAMIN PO) Take by mouth daily.    . Omega-3 Fatty Acids (FISH OIL PO) Take by mouth as needed.     No current facility-administered medications for this visit.    ROS: A comprehensive review of systems was negative.  Exam:    BP 130/72 mmHg  Pulse 60  Resp 16  Wt 146 lb (66.225 kg)  General appearance: alert and cooperative Head: Normocephalic, without obvious abnormality, atraumatic Neck: no adenopathy, supple, symmetrical, trachea midline and thyroid not enlarged, symmetric, no tenderness/mass/nodules Lungs: clear to auscultation bilaterally Heart: regular rate and rhythm, S1, S2 normal, no murmur, click, rub or gallop Abdomen: soft, non-tender; bowel sounds normal; no masses,  no organomegaly Extremities: extremities normal, atraumatic, no cyanosis or edema Skin: Skin color, texture, turgor normal. No rashes or lesions Lymph nodes: Cervical, supraclavicular, and axillary nodes normal. no inguinal nodes palpated Neurologic: Grossly normal  Pelvic: not performed today  A: large endocervical polyp  PMP bleeding   Latex allergy  P:  Excision of endocervical polyp as well as hysteroscopy with possible polyp resection, D&C planned Rx for Motrin and Vicodin given. Medications/Vitamins reviewed.  Pre and post procedure instructions provided.  All questions answered.  ~15 minutes spent with patient >50% of time was in face to face discussion of above.

## 2015-04-23 ENCOUNTER — Encounter: Payer: Self-pay | Admitting: Obstetrics & Gynecology

## 2015-05-06 ENCOUNTER — Telehealth: Payer: Self-pay | Admitting: *Deleted

## 2015-05-06 NOTE — Telephone Encounter (Signed)
Call to patient, notified surgery start time adjusted to 0900 and arrive at 0730. Patient agreeable.  Routing to provider for final review. Patient agreeable to disposition. Will close encounter.

## 2015-05-08 NOTE — Anesthesia Preprocedure Evaluation (Addendum)
Anesthesia Evaluation  Patient identified by MRN, date of birth, ID band Patient awake    Reviewed: Allergy & Precautions, NPO status , Patient's Chart, lab work & pertinent test results  History of Anesthesia Complications Negative for: history of anesthetic complications  Airway Mallampati: II  TM Distance: >3 FB Neck ROM: Full    Dental no notable dental hx. (+) Dental Advisory Given   Pulmonary neg pulmonary ROS,  breath sounds clear to auscultation  Pulmonary exam normal       Cardiovascular negative cardio ROS Normal cardiovascular examRhythm:Regular Rate:Normal     Neuro/Psych negative neurological ROS  negative psych ROS   GI/Hepatic negative GI ROS, Neg liver ROS,   Endo/Other  negative endocrine ROS  Renal/GU negative Renal ROS  negative genitourinary   Musculoskeletal negative musculoskeletal ROS (+)   Abdominal   Peds negative pediatric ROS (+)  Hematology Postmenopausal bleeding    Anesthesia Other Findings   Reproductive/Obstetrics negative OB ROS                             Anesthesia Physical Anesthesia Plan  ASA: II  Anesthesia Plan: General   Post-op Pain Management:    Induction: Intravenous  Airway Management Planned: LMA  Additional Equipment:   Intra-op Plan:   Post-operative Plan: Extubation in OR  Informed Consent: I have reviewed the patients History and Physical, chart, labs and discussed the procedure including the risks, benefits and alternatives for the proposed anesthesia with the patient or authorized representative who has indicated his/her understanding and acceptance.   Dental advisory given  Plan Discussed with: CRNA  Anesthesia Plan Comments:         Anesthesia Quick Evaluation

## 2015-05-09 ENCOUNTER — Ambulatory Visit (HOSPITAL_COMMUNITY): Payer: Managed Care, Other (non HMO) | Admitting: Anesthesiology

## 2015-05-09 ENCOUNTER — Encounter (HOSPITAL_COMMUNITY)
Admission: RE | Disposition: A | Discharge status: Home / Self Care | Payer: Self-pay | Source: Ambulatory Visit | Attending: Obstetrics & Gynecology

## 2015-05-09 ENCOUNTER — Ambulatory Visit (HOSPITAL_COMMUNITY)
Admission: RE | Admit: 2015-05-09 | Discharge: 2015-05-09 | Disposition: A | Discharge status: Home / Self Care | Payer: Managed Care, Other (non HMO) | Source: Ambulatory Visit | Attending: Obstetrics & Gynecology | Admitting: Obstetrics & Gynecology

## 2015-05-09 ENCOUNTER — Encounter (HOSPITAL_COMMUNITY): Payer: Self-pay | Admitting: Obstetrics & Gynecology

## 2015-05-09 DIAGNOSIS — N760 Acute vaginitis: Secondary | ICD-10-CM | POA: Diagnosis not present

## 2015-05-09 DIAGNOSIS — N852 Hypertrophy of uterus: Secondary | ICD-10-CM | POA: Insufficient documentation

## 2015-05-09 DIAGNOSIS — N841 Polyp of cervix uteri: Secondary | ICD-10-CM | POA: Insufficient documentation

## 2015-05-09 DIAGNOSIS — N950 Postmenopausal bleeding: Secondary | ICD-10-CM | POA: Insufficient documentation

## 2015-05-09 HISTORY — PX: CERVICAL POLYPECTOMY: SHX88

## 2015-05-09 HISTORY — PX: HYSTEROSCOPY WITH D & C: SHX1775

## 2015-05-09 LAB — CBC
HCT: 39.7 % (ref 36.0–46.0)
HEMOGLOBIN: 13.4 g/dL (ref 12.0–15.0)
MCH: 32.2 pg (ref 26.0–34.0)
MCHC: 33.8 g/dL (ref 30.0–36.0)
MCV: 95.4 fL (ref 78.0–100.0)
Platelets: 225 10*3/uL (ref 150–400)
RBC: 4.16 MIL/uL (ref 3.87–5.11)
RDW: 13.5 % (ref 11.5–15.5)
WBC: 6.4 10*3/uL (ref 4.0–10.5)

## 2015-05-09 LAB — PREGNANCY, URINE: PREG TEST UR: NEGATIVE

## 2015-05-09 SURGERY — DILATATION AND CURETTAGE /HYSTEROSCOPY
Anesthesia: General

## 2015-05-09 MED ORDER — ONDANSETRON HCL 4 MG/2ML IJ SOLN: 4.0000 mg | Freq: Once | INTRAMUSCULAR | Status: DC | PRN

## 2015-05-09 MED ORDER — FENTANYL CITRATE (PF) 100 MCG/2ML IJ SOLN
INTRAMUSCULAR | Status: AC
  Filled 2015-05-09: qty 2

## 2015-05-09 MED ORDER — KETOROLAC TROMETHAMINE 30 MG/ML IJ SOLN
INTRAMUSCULAR | Status: DC | PRN
  Administered 2015-05-09: 30 mg via INTRAVENOUS
  Administered 2015-05-09: 30 mg via INTRAMUSCULAR

## 2015-05-09 MED ORDER — DEXAMETHASONE SODIUM PHOSPHATE 4 MG/ML IJ SOLN
INTRAMUSCULAR | Status: DC | PRN
  Administered 2015-05-09: 4 mg via INTRAVENOUS

## 2015-05-09 MED ORDER — CEFAZOLIN SODIUM-DEXTROSE 2-3 GM-% IV SOLR
INTRAVENOUS | Status: AC
  Filled 2015-05-09: qty 50

## 2015-05-09 MED ORDER — PHENYLEPHRINE 40 MCG/ML (10ML) SYRINGE FOR IV PUSH (FOR BLOOD PRESSURE SUPPORT)
PREFILLED_SYRINGE | INTRAVENOUS | Status: AC
  Filled 2015-05-09: qty 10

## 2015-05-09 MED ORDER — LIDOCAINE HCL (CARDIAC) 20 MG/ML IV SOLN
INTRAVENOUS | Status: DC | PRN
  Administered 2015-05-09: 60 mg via INTRAVENOUS

## 2015-05-09 MED ORDER — MIDAZOLAM HCL 2 MG/2ML IJ SOLN
INTRAMUSCULAR | Status: AC
  Filled 2015-05-09: qty 2

## 2015-05-09 MED ORDER — FENTANYL CITRATE (PF) 250 MCG/5ML IJ SOLN
INTRAMUSCULAR | Status: AC
  Filled 2015-05-09: qty 5

## 2015-05-09 MED ORDER — PROPOFOL 10 MG/ML IV BOLUS
INTRAVENOUS | Status: AC
  Filled 2015-05-09: qty 20

## 2015-05-09 MED ORDER — LIDOCAINE-EPINEPHRINE 1 %-1:100000 IJ SOLN
INTRAMUSCULAR | Status: AC
  Filled 2015-05-09: qty 1

## 2015-05-09 MED ORDER — PHENYLEPHRINE HCL 10 MG/ML IJ SOLN
INTRAMUSCULAR | Status: DC | PRN
  Administered 2015-05-09: 40 ug via INTRAVENOUS

## 2015-05-09 MED ORDER — FENTANYL CITRATE (PF) 100 MCG/2ML IJ SOLN
INTRAMUSCULAR | Status: DC | PRN
Start: 2015-05-09 — End: 2015-05-09
  Administered 2015-05-09 (×2): 50 ug via INTRAVENOUS

## 2015-05-09 MED ORDER — MIDAZOLAM HCL 2 MG/2ML IJ SOLN
INTRAMUSCULAR | Status: DC | PRN
  Administered 2015-05-09: 2 mg via INTRAVENOUS

## 2015-05-09 MED ORDER — DEXAMETHASONE SODIUM PHOSPHATE 4 MG/ML IJ SOLN
INTRAMUSCULAR | Status: AC
  Filled 2015-05-09: qty 1

## 2015-05-09 MED ORDER — LIDOCAINE-EPINEPHRINE 1 %-1:100000 IJ SOLN
INTRAMUSCULAR | Status: DC | PRN
  Administered 2015-05-09: 10 mL

## 2015-05-09 MED ORDER — LACTATED RINGERS IV SOLN
INTRAVENOUS | Status: DC
  Administered 2015-05-09 (×2): via INTRAVENOUS

## 2015-05-09 MED ORDER — KETOROLAC TROMETHAMINE 30 MG/ML IJ SOLN
INTRAMUSCULAR | Status: AC
  Filled 2015-05-09: qty 1

## 2015-05-09 MED ORDER — ONDANSETRON HCL 4 MG/2ML IJ SOLN
INTRAMUSCULAR | Status: DC | PRN
  Administered 2015-05-09: 4 mg via INTRAVENOUS

## 2015-05-09 MED ORDER — LIDOCAINE HCL (CARDIAC) 20 MG/ML IV SOLN
INTRAVENOUS | Status: AC
  Filled 2015-05-09: qty 5

## 2015-05-09 MED ORDER — DEXTROSE 5 % IV SOLN: 2.0000 g | INTRAVENOUS | Status: DC

## 2015-05-09 MED ORDER — PROPOFOL 10 MG/ML IV BOLUS
INTRAVENOUS | Status: DC | PRN
  Administered 2015-05-09: 150 mg via INTRAVENOUS

## 2015-05-09 MED ORDER — ONDANSETRON HCL 4 MG/2ML IJ SOLN
INTRAMUSCULAR | Status: AC
  Filled 2015-05-09: qty 2

## 2015-05-09 MED ORDER — FENTANYL CITRATE (PF) 100 MCG/2ML IJ SOLN
25.0000 ug | INTRAMUSCULAR | Status: DC | PRN
  Administered 2015-05-09: 50 ug via INTRAVENOUS

## 2015-05-09 MED ORDER — SCOPOLAMINE 1 MG/3DAYS TD PT72
1.0000 | MEDICATED_PATCH | Freq: Once | TRANSDERMAL | Status: DC
  Administered 2015-05-09: 1.5 mg via TRANSDERMAL

## 2015-05-09 MED ORDER — CEFAZOLIN SODIUM-DEXTROSE 2-3 GM-% IV SOLR
2.0000 g | INTRAVENOUS | Status: AC
  Administered 2015-05-09: 2 g via INTRAVENOUS

## 2015-05-09 MED ORDER — GLYCINE 1.5 % IR SOLN
Status: DC | PRN
  Administered 2015-05-09: 3000 mL

## 2015-05-09 MED ORDER — SCOPOLAMINE 1 MG/3DAYS TD PT72
MEDICATED_PATCH | TRANSDERMAL | Status: AC
  Administered 2015-05-09: 1.5 mg via TRANSDERMAL
  Filled 2015-05-09: qty 1

## 2015-05-09 SURGICAL SUPPLY — 19 items
CANISTER SUCT 3000ML (MISCELLANEOUS) ×3 IMPLANT
CATH ROBINSON RED A/P 16FR (CATHETERS) ×3 IMPLANT
CATH SILICONE 16FRX5CC (CATHETERS) ×3 IMPLANT
CLOTH BEACON ORANGE TIMEOUT ST (SAFETY) ×3 IMPLANT
CONTAINER PREFILL 10% NBF 60ML (FORM) ×6 IMPLANT
DILATOR CANAL MILEX (MISCELLANEOUS) IMPLANT
ELECT REM PT RETURN 9FT ADLT (ELECTROSURGICAL)
ELECTRODE REM PT RTRN 9FT ADLT (ELECTROSURGICAL) IMPLANT
GLOVE BIOGEL PI IND STRL 7.0 (GLOVE) ×1 IMPLANT
GLOVE BIOGEL PI INDICATOR 7.0 (GLOVE) ×2
GLOVE ECLIPSE 6.5 STRL STRAW (GLOVE) ×6 IMPLANT
GOWN STRL REUS W/TWL LRG LVL3 (GOWN DISPOSABLE) ×6 IMPLANT
LOOP ANGLED CUTTING 22FR (CUTTING LOOP) IMPLANT
PACK VAGINAL MINOR WOMEN LF (CUSTOM PROCEDURE TRAY) ×3 IMPLANT
PAD OB MATERNITY 4.3X12.25 (PERSONAL CARE ITEMS) ×3 IMPLANT
TOWEL OR 17X24 6PK STRL BLUE (TOWEL DISPOSABLE) ×6 IMPLANT
TUBING AQUILEX INFLOW (TUBING) ×3 IMPLANT
TUBING AQUILEX OUTFLOW (TUBING) ×3 IMPLANT
WATER STERILE IRR 1000ML POUR (IV SOLUTION) ×3 IMPLANT

## 2015-05-09 NOTE — Anesthesia Postprocedure Evaluation (Signed)
  Anesthesia Post-op Note  Patient: MONEY MCKEITHAN  Procedure(s) Performed: Procedure(s) (LRB): DILATATION AND CURETTAGE /HYSTEROSCOPY (N/A) CERVICAL POLYPECTOMY WITH ENDOMETRIAL CURRETTINGS AND VAGINAL BIOPSY (N/A)  Patient Location: PACU  Anesthesia Type: General  Level of Consciousness: awake and alert   Airway and Oxygen Therapy: Patient Spontanous Breathing  Post-op Pain: mild  Post-op Assessment: Post-op Vital signs reviewed, Patient's Cardiovascular Status Stable, Respiratory Function Stable, Patent Airway and No signs of Nausea or vomiting  Last Vitals:  Filed Vitals:   05/09/15 0945  BP: 99/54  Pulse: 65  Temp:   Resp: 12    Post-op Vital Signs: stable   Complications: No apparent anesthesia complications

## 2015-05-09 NOTE — Discharge Instructions (Signed)
Post-surgical Instructions, Outpatient Surgery  You may expect to feel dizzy, weak, and drowsy for as long as 24 hours after receiving the medicine that made you sleep (anesthetic). For the first 24 hours after your surgery:    Do not drive a car, ride a bicycle, participate in physical activities, or take public transportation until you are done taking narcotic pain medicines or as directed by Dr. Sabra Heck.   Do not drink alcohol or take tranquilizers.   Do not take medicine that has not been prescribed by your physicians.   Do not sign important papers or make important decisions while on narcotic pain medicines.   Have a responsible person with you.    PAIN MANAGEMENT  Motrin 800mg .  (This is the same as 4-200mg  over the counter tablets of Motrin or ibuprofen.)  You may take this every eight hours or as needed for cramping.    Vicodin 5/325mg .  For more severe pain, take one or two tablets every four to six hours as needed for pain control.  (Remember that narcotic pain medications increase your risk of constipation.  If this becomes a problem, you may take an over the counter stool softener like Colace 100mg  up to four times a day.)  DO'S AND DON'T'S  Do not take a tub bath for one week.  You may shower on the first day after your surgery  Do not do any heavy lifting for one to two weeks.  This increases the chance of bleeding.  Do move around as you feel able.  Stairs are fine.  You may begin to exercise again as you feel able.  Do not lift any weights for two weeks.  Do not put anything in the vagina for two weeks--no tampons, intercourse, or douching.    REGULAR MEDIATIONS/VITAMINS:  You may restart all of your regular medications as prescribed.  You may restart all of your vitamins as you normally take them.    PLEASE CALL OR SEEK MEDICAL CARE IF:  You have persistent nausea and vomiting.   You have trouble eating or drinking.   You have an oral temperature above  100.5.   You have constipation that is not helped by adjusting diet or increasing fluid intake. Pain medicines are a common cause of constipation.   You have heavy vaginal bleeding  You have redness or drainage from your incision(s) or there is increasing pain or tenderness near or in the surgical site.    TAKE OFF THE PATCH BEHIND YOUR EAR WITH A TISSUE ON 05/11/15.  [place] HANDS AFTER REMOVAL. MAY TAKE IBUPROFEN (MOTRIN, ADVIL) OR ALEVE AFTER 3:00 FOR PAIN.

## 2015-05-09 NOTE — Transfer of Care (Signed)
Immediate Anesthesia Transfer of Care Note  Patient: Karina Gaines  Procedure(s) Performed: Procedure(s): DILATATION AND CURETTAGE /HYSTEROSCOPY (N/A) CERVICAL POLYPECTOMY WITH ENDOMETRIAL CURRETTINGS AND VAGINAL BIOPSY (N/A)  Patient Location: PACU  Anesthesia Type:General  Level of Consciousness: awake, alert  and oriented  Airway & Oxygen Therapy: Patient Spontanous Breathing and Patient connected to nasal cannula oxygen  Post-op Assessment: Report given to RN and Post -op Vital signs reviewed and stable  Post vital signs: Reviewed and stable  Last Vitals:  Filed Vitals:   05/09/15 0845  BP: 114/56  Pulse: 78  Temp: 36.6 C  Resp: 14    Complications: No apparent anesthesia complications

## 2015-05-09 NOTE — H&P (Signed)
54 y.o. G0P0000 MarriedCaucasianF here for removal of large cervical polyp, hysteroscopy and D&C with possible endometrial polyp resection due to PMP bleeding and presence of large endometrial polyp.  Procedure, risks, and benefits have been discussed with pt.  Pt did have an ultrasound performed 03/03/15 showing a uterus measuring 10.6 x 8.7 x 6.9cm with 4.44mm endometrium.  Alternatives, including doing nothing or in-office removal of cervical polyp have been discussed.  Pt's exam is quite difficult and she had to take Valium for me to even adequately see the polyp.  Due to this, outpt procedure has been decided upon and pt is in agreement.    No LMP recorded. Patient is not currently having periods (Reason: Perimenopausal).          Sexually active: Yes.    The current method of family planning is post menopausal status.    Smoker:  no Pap:  02/18/15 MMG:  05/28/15   reports that she has never smoked. She has never used smokeless tobacco. She reports that she does not drink alcohol or use illicit drugs.  Past Medical History  Diagnosis Date  . Adenomyosis 1/11  . Dysplasia of cervix, low grade (CIN 1) 1995  . Cervical polyp     Past Surgical History  Procedure Laterality Date  . Colposcopy    . Tonsillectomy    . Facial reconstruction surgery      No current facility-administered medications for this encounter.   Current Outpatient Prescriptions  Medication Sig Dispense Refill  . ibuprofen (ADVIL,MOTRIN) 200 MG tablet Take 200 mg by mouth every 6 (six) hours as needed for headache.    Marland Kitchen CALCIUM PO Take by mouth as needed.     . Cholecalciferol (VITAMIN D PO) Take by mouth as needed.    . diazepam (VALIUM) 5 MG tablet Take 1 tablet (5 mg total) by mouth every 6 (six) hours as needed for anxiety. (Patient not taking: Reported on 04/26/2015) 10 tablet 0  . HYDROcodone-acetaminophen (NORCO/VICODIN) 5-325 MG per tablet Take 1-2 tablets by mouth every 6 (six) hours as needed for moderate  pain or severe pain. (Patient not taking: Reported on 04/26/2015) 12 tablet 0  . ibuprofen (ADVIL,MOTRIN) 800 MG tablet Take 1 tablet (800 mg total) by mouth every 8 (eight) hours as needed. (Patient not taking: Reported on 04/26/2015) 20 tablet 0  . Multiple Vitamins-Minerals (MULTIVITAMIN PO) Take by mouth daily.    . Omega-3 Fatty Acids (FISH OIL PO) Take by mouth as needed.      Family History  Problem Relation Age of Onset  . Diabetes Mother   . Heart disease Mother   . Hypertension Mother   . Heart disease Father     Psychologist, forensic  . Mental illness Brother     ROS:  Pertinent items are noted in HPI.  Otherwise, a comprehensive ROS was negative.  Exam:   General appearance: alert, cooperative and appears stated age Head: Normocephalic, without obvious abnormality, atraumatic Lungs: clear to auscultation bilaterally Heart: regular rate and rhythm Abdomen: soft, non-tender; bowel sounds normal; no masses,  no organomegaly Extremities: extremities normal, atraumatic, no cyanosis or edema Skin: Skin color, texture, turgor normal. No rashes or lesions Neurologic: Grossly normal  A:  54 yo G0 MWF with large cervical polyp, PMP bleeding, enlarged uterus, difficulty with office examinations.  P:   Excision of cervical polyp, hysteroscopy and D&C planned.  All questions answered.  Pt is here and ready to proceed.

## 2015-05-09 NOTE — Op Note (Signed)
05/09/2015  9:02 AM  PATIENT:  Karina Gaines  54 y.o. female  PRE-OPERATIVE DIAGNOSIS:  Large endocervical polyp, PMP bleeding, enlarged uterus  POST-OPERATIVE DIAGNOSIS:  Large endocervical polyp, PMP bleeding, enlarged uterus   PROCEDURE:  Procedure(s): DILATATION AND CURETTAGE /HYSTEROSCOPY CERVICAL POLYPECTOMY WITH ENDOMETRIAL CURRETTINGS AND VAGINAL BIOPSY  SURGEON:  Rochell Puett SUZANNE  ASSISTANTS: OR staff   ANESTHESIA:   general  ESTIMATED BLOOD LOSS: 10cc  BLOOD ADMINISTERED:none   FLUIDS: 1000cc LR  UOP: 50cc clear UOP drained at beginning of procedure  SPECIMEN:  Endocervical polyp, endometrial curetting, posterior vaginal biopsy  DISPOSITION OF SPECIMEN:  PATHOLOGY  FINDINGS: Large endocervical polyp, thin appearing endometrium, erythematous posterior vaginal lesion  DESCRIPTION OF OPERATION: Patient was taken to the operating room.  She is placed in the supine position. SCDs were on her lower extremities and functioning properly. General anesthesia with an LMA was administered without difficulty.   Legs were then placed in the [place] in the low lithotomy position. The legs were lifted to the high lithotomy position and the Betadine prep was used on the inner thighs perineum and vagina x3. Patient was draped in a normal standard fashion. An in and out catheterization with a red rubber Foley catheter was performed. Approximately 50 cc of clear urine was noted. A bivalve speculum was placed the vagina. The anterior lip of the cervix was grasped with single-tooth tenaculum.  A paracervical block of 1% lidocaine mixed one-to-one with epinephrine (1:100,000 units).  10 cc was used total. The cervix is dilated up to #21 [employer] dilators. The endometrial cavity sounded to 10 cm.  First the large cervical polyp was removed with several grasped of a polyp forceps, minimal bleeding was noted.  Cervical canal was curetted with a Kevorkain curette when this was completed  to ensure polyp was fully removed.  Then a 2.9 millimeter diagnostic hysteroscope was obtained. 1.5% glycine was used as a hysteroscopic fluid. The hysteroscope was advanced through the endocervical canal into the endometrial cavity. The tubal ostia were noted bilaterally. The endometrium was thin.  The scope was removed and the cervical canal was visualized as the scope was removed.  No remaining portion of the polyp was noted.  Then a #1 toothed curette was used to curette the cavity until a rough gritty texture was noted in all quadrants. Using a biopsy forcep, the posterior vaginal lesion was biopsies.  Silver nitrate was used on this for excellent hemostasis.  At this point the procedure was ended.  The fluid deficit was 0 cc. The tenaculum was removed from the anterior lip of the cervix. The speculum was removed from the vagina. The prep was cleansed of the patient's skin. The legs are positioned back in the supine position. Sponge, lap, needle, initially counts were correct x2. Patient was taken to recovery in stable condition.  COUNTS:  YES  PLAN OF CARE: Transfer to PACU

## 2015-05-09 NOTE — Anesthesia Procedure Notes (Signed)
Procedure Name: LMA Insertion Date/Time: 05/09/2015 8:29 AM Performed by: Elenore Paddy Pre-anesthesia Checklist: Patient identified, Emergency Drugs available, Suction available, Timeout performed and Patient being monitored Patient Re-evaluated:Patient Re-evaluated prior to inductionOxygen Delivery Method: Circle system utilized Preoxygenation: Pre-oxygenation with 100% oxygen Intubation Type: IV induction LMA: LMA inserted LMA Size: 4.0 Tube type: Oral Number of attempts: 1 Placement Confirmation: positive ETCO2 and breath sounds checked- equal and bilateral

## 2015-05-10 ENCOUNTER — Encounter (HOSPITAL_COMMUNITY): Payer: Self-pay | Admitting: Obstetrics & Gynecology

## 2015-05-16 ENCOUNTER — Telehealth: Payer: Self-pay | Admitting: Obstetrics & Gynecology

## 2015-05-16 MED ORDER — AMOXICILLIN-POT CLAVULANATE 875-125 MG PO TABS: 1.0000 | ORAL_TABLET | Freq: Two times a day (BID) | ORAL | Status: DC

## 2015-05-16 NOTE — Telephone Encounter (Signed)
Patient is asking to talk with Dr.Milelr's nurse. Patient had surgery last week.

## 2015-05-16 NOTE — Telephone Encounter (Signed)
OK to call in augmentin 875 bid x 7 days.  If worsens, she needs to follow up with PCP or with urgent care.

## 2015-05-16 NOTE — Telephone Encounter (Signed)
Spoke with patient. Advised of message as seen below from [place]. Patient is agreeable. Rx for Augmentin 875 bid x 7 days #14 0RF sent to pharmacy on file. Patient is agreeable and verbalizes understanding.  Routing to provider for final review. Patient agreeable to disposition. Will close encounter.   Patient aware provider will review message and nurse will return call if any additional advice or change of disposition.

## 2015-05-16 NOTE — Telephone Encounter (Signed)
Spoke with patient. Patient had D&C Hysteroscopy on 05/09/2015. States that after surgery her throat was very dry. Throat began to get sore over the weekend and is now red and irritated. States she has having "drainage" into her throat. Has been experiencing chills, has not taken her temperature. Denies any coughing, sneezing, body aches, nausea, or vomiting. "I thought I would call to see if she could help me since I just had surgery or if I need to go to see another doctor?" Advised I will speak with Dr.Miller and return call.

## 2015-05-20 ENCOUNTER — Telehealth: Payer: Self-pay

## 2015-05-20 NOTE — Telephone Encounter (Signed)
Lmtcb//kn 

## 2015-05-23 ENCOUNTER — Encounter: Payer: Self-pay | Admitting: Obstetrics & Gynecology

## 2015-05-23 ENCOUNTER — Ambulatory Visit (INDEPENDENT_AMBULATORY_CARE_PROVIDER_SITE_OTHER): Payer: Managed Care, Other (non HMO) | Admitting: Obstetrics & Gynecology

## 2015-05-23 VITALS — BP 108/70 | HR 80 | Resp 16 | Wt 146.0 lb

## 2015-05-23 DIAGNOSIS — N950 Postmenopausal bleeding: Secondary | ICD-10-CM

## 2015-05-23 NOTE — Progress Notes (Signed)
Patient ID: Karina Gaines, female   DOB: [birth year], 54 y.o.   MRN: [id number] Post Operative Visit  Procedure:   DILATATION AND CURETTAGE/HYSTEROSCOPY   CERVICAL POLYPECTOMY    Days Post-op: 14 days   Subjective: Doing well.  No vaginal bleeding except the first three days post operatively.  Pt has some sinusitis/pharyngitisis symptoms post op.  Ended up going to her PCP.  Is on Amoxicillin until the end of the week.  Has rx for Diflucan if needed.  Pt reports she is better.  No cramping.    Pathology reviewed with pt again today.  Objective: BP 108/70 mmHg  Pulse 80  Resp 16  Wt 146 lb (66.225 kg)  EXAM General: alert and no distress GI: soft, non-tender; bowel sounds normal; no masses,  no organomegaly Extremities: extremities normal, atraumatic, no cyanosis or edema Vaginal Bleeding: none  GYN: NAEFG, vaginal without discharge or lesions, cervix without polyp and healing well, clear discharge at cervix noted, no CMT  Assessment: s/p hysteroscopy, D&C, cervical polyp resection  Plan: Follow up for AEX with Karina Gaines, 02/22/16.  Appt is already scheduled.

## 2015-06-28 NOTE — Telephone Encounter (Signed)
Patient was seen on 05/23/15.//kn

## 2015-08-15 ENCOUNTER — Other Ambulatory Visit: Payer: Self-pay

## 2015-08-15 DIAGNOSIS — Z1231 Encounter for screening mammogram for malignant neoplasm of breast: Secondary | ICD-10-CM

## 2015-08-31 ENCOUNTER — Ambulatory Visit
Admission: RE | Admit: 2015-08-31 | Discharge: 2015-08-31 | Disposition: A | Discharge status: Home / Self Care | Payer: Managed Care, Other (non HMO) | Source: Ambulatory Visit

## 2015-08-31 DIAGNOSIS — Z1231 Encounter for screening mammogram for malignant neoplasm of breast: Secondary | ICD-10-CM

## 2016-02-22 ENCOUNTER — Encounter: Payer: Self-pay | Admitting: Certified Nurse Midwife

## 2016-02-22 ENCOUNTER — Ambulatory Visit (INDEPENDENT_AMBULATORY_CARE_PROVIDER_SITE_OTHER): Payer: Managed Care, Other (non HMO) | Admitting: Certified Nurse Midwife

## 2016-02-22 VITALS — BP 110/70 | HR 70 | Resp 16 | Ht 63.5 in | Wt 147.0 lb

## 2016-02-22 DIAGNOSIS — Z1211 Encounter for screening for malignant neoplasm of colon: Secondary | ICD-10-CM

## 2016-02-22 DIAGNOSIS — Z01419 Encounter for gynecological examination (general) (routine) without abnormal findings: Secondary | ICD-10-CM | POA: Diagnosis not present

## 2016-02-22 DIAGNOSIS — Z87440 Personal history of urinary (tract) infections: Secondary | ICD-10-CM

## 2016-02-22 DIAGNOSIS — Z0000 Encounter for general adult medical examination without abnormal findings: Secondary | ICD-10-CM | POA: Diagnosis not present

## 2016-02-22 LAB — POCT URINALYSIS DIPSTICK
Bilirubin, UA: NEGATIVE
Glucose, UA: NEGATIVE
KETONES UA: NEGATIVE
LEUKOCYTES UA: NEGATIVE
NITRITE UA: NEGATIVE
PH UA: 5
PROTEIN UA: NEGATIVE
Urobilinogen, UA: NEGATIVE

## 2016-02-22 LAB — LIPID PANEL
Cholesterol: 219 mg/dL — ABNORMAL HIGH (ref 125–200)
HDL: 59 mg/dL (ref >=46)
LDL CALC: 138 mg/dL — AB (ref <130)
Total CHOL/HDL Ratio: 3.7 Ratio (ref <=5.0)
Triglycerides: 112 mg/dL (ref <150)
VLDL: 22 mg/dL (ref <30)

## 2016-02-22 LAB — TSH: TSH: 2.47 m[IU]/L

## 2016-02-22 NOTE — Addendum Note (Signed)
Addended by: Regina Eck on: 02/22/2016 04:32 PM   Modules accepted: Orders, SmartSet

## 2016-02-22 NOTE — Progress Notes (Signed)
Reviewed personally.  M. Suzanne Renna Kilmer, MD.  

## 2016-02-22 NOTE — Patient Instructions (Signed)

## 2016-02-22 NOTE — Progress Notes (Signed)
55 y.o. G0P0000 Married  Caucasian Fe here for annual exam. Menopausal with night sweat, no issues with insomnia. Occasional hot flash. Denies any vaginal bleeding now after polyp removal with Dr.Miller. Denies occasional vaginal dryness. Not sure if will have colonoscopy this year.  Patient had rash on legs and saw dermatology Dr. Michele Mcalpine, biopsy taken and was negative. Labs for Sjorens /Celiac and CMP were done. No concerns. Was treated with steroids and symptoms are better. Allergic to Macrobid now.. She brought copies of labs. No other health concerns today.  Patient's last menstrual period was 01/22/2015.          Sexually active: Yes.    The current method of family planning is condoms sometimes.    Exercising: Yes.    walking & hiking Smoker:  no  Health Maintenance: Pap: 02-18-15 neg HPV HR neg MMG:  08-31-15 category c density,birads 1:neg Colonoscopy: none BMD:   none TDaP:  2010 Shingles: no Pneumonia: no Hep C and HIV:  Not done Labs: poct urine-rbc tr Self breast exam: done occ   reports that she has never smoked. She has never used smokeless tobacco. She reports that she does not drink alcohol or use illicit drugs.  Past Medical History  Diagnosis Date  . Adenomyosis 1/11  . Dysplasia of cervix, low grade (CIN 1) 1995  . Cervical polyp     Past Surgical History  Procedure Laterality Date  . Colposcopy    . Tonsillectomy    . Facial reconstruction surgery    . Hysteroscopy w/d&c N/A 05/09/2015    Procedure: DILATATION AND CURETTAGE /HYSTEROSCOPY;  Surgeon: Megan Salon, MD;  Location: [facility];  Service: Gynecology;  Laterality: N/A;  . Cervical polypectomy N/A 05/09/2015    Procedure: CERVICAL POLYPECTOMY WITH ENDOMETRIAL CURRETTINGS AND VAGINAL BIOPSY;  Surgeon: Megan Salon, MD;  Location: [facility];  Service: Gynecology;  Laterality: N/A;  . Hysteroscopy      No current outpatient prescriptions on file.   No current facility-administered medications for this visit.     Family History  Problem Relation Age of Onset  . Diabetes Mother   . Heart disease Mother   . Hypertension Mother   . Heart disease Father     Psychologist, forensic  . Mental illness Brother     ROS:  Pertinent items are noted in HPI.  Otherwise, a comprehensive ROS was negative.  Exam:   BP 110/70 mmHg  Pulse 70  Resp 16  Ht 5' 3.5" (1.613 m)  Wt 147 lb (66.679 kg)  BMI 25.63 kg/m2  LMP 01/22/2015 Height: 5' 3.5" (161.3 cm) Ht Readings from Last 3 Encounters:  02/22/16 5' 3.5" (1.613 m)  05/09/15 5\' 3"  (1.6 m)  02/18/15 5' 3.5" (1.613 m)    General appearance: alert, cooperative and appears stated age Head: Normocephalic, without obvious abnormality, atraumatic Neck: no adenopathy, supple, symmetrical, trachea midline and thyroid normal to inspection and palpation Lungs: clear to auscultation bilaterally Breasts: normal appearance, no masses or tenderness, No nipple retraction or dimpling, No nipple discharge or bleeding, No axillary or supraclavicular adenopathy Heart: regular rate and rhythm Abdomen: soft, non-tender; no masses,  no organomegaly Extremities: extremities normal, atraumatic, no cyanosis or edema Skin: Skin color, texture, turgor normal. No lesions. Faint fine macular pink rash noted on legs only. Non tender Lymph nodes: Cervical, supraclavicular, and axillary nodes normal. No abnormal inguinal nodes palpated Neurologic: Grossly normal   Pelvic: External genitalia:  no lesions  Urethra:  normal appearing urethra with no masses, tenderness or lesions              Bartholin's and Skene's: normal                 Vagina: normal appearing vagina with normal color and discharge, no lesions              Cervix: no cervical motion tenderness, no lesions and normal appearance              Pap taken: No. Bimanual Exam:  Uterus:  enlarged, 10-12 weeks size, non tender, no mases noted. weeks size Patient diagnosed with adenomyosis 03/03/15, no change in size  from my previous exam              Adnexa: normal adnexa and no mass, fullness, tenderness               Rectovaginal: Confirms               Anus:  normal sphincter tone, no lesions  Chaperone present: yes  A:  Well Woman with normal exam  Menopausal no HRT  History of adenomyosis of uterus, no uterine size change  PMB in 2016 with cervical polyp and D&C  05/09/15, no bleeding since  Allergy to Macrobid with skin rash with evaluation with dermatology  Colonoscopy due, declines at this time, will call when ready to schedule  Screening labs    P:   Reviewed health and wellness pertinent to exam  Aware of need to evaluate if vaginal bleeding  Allergy added to medication list, patient added to hers in purse  IFOB dispensed  Lab: TSH, Vit.D Hep C, HIV, Lipid panel  Pap smear as above not taken   counseled on breast self exam, mammography screening, adequate intake of calcium and vitamin D, diet and exercise  return annually or prn  An After Visit Summary was printed and given to the patient.

## 2016-02-23 LAB — URINALYSIS, MICROSCOPIC ONLY
Bacteria, UA: NONE SEEN [HPF]
Casts: NONE SEEN [LPF]
Crystals: NONE SEEN [HPF]
RBC / HPF: NONE SEEN RBC/HPF (ref <=2)
Squamous Epithelial / LPF: NONE SEEN [HPF] (ref <=5)
WBC UA: NONE SEEN WBC/HPF (ref <=5)
Yeast: NONE SEEN [HPF]

## 2016-02-23 LAB — VITAMIN D 25 HYDROXY (VIT D DEFICIENCY, FRACTURES): VIT D 25 HYDROXY: 31 ng/mL (ref 30–100)

## 2016-02-23 LAB — HEPATITIS C ANTIBODY: HCV AB: NEGATIVE

## 2016-02-23 LAB — HIV ANTIBODY (ROUTINE TESTING W REFLEX): HIV: NONREACTIVE

## 2016-02-24 LAB — URINE CULTURE
Colony Count: NO GROWTH
ORGANISM ID, BACTERIA: NO GROWTH

## 2016-03-14 ENCOUNTER — Encounter: Payer: Self-pay | Admitting: Certified Nurse Midwife

## 2016-08-23 ENCOUNTER — Other Ambulatory Visit: Payer: Self-pay | Admitting: Certified Nurse Midwife

## 2016-08-23 DIAGNOSIS — Z1231 Encounter for screening mammogram for malignant neoplasm of breast: Secondary | ICD-10-CM

## 2016-09-17 ENCOUNTER — Ambulatory Visit
Admission: RE | Admit: 2016-09-17 | Discharge: 2016-09-17 | Disposition: A | Discharge status: Home / Self Care | Payer: Managed Care, Other (non HMO) | Source: Ambulatory Visit | Attending: Certified Nurse Midwife | Admitting: Certified Nurse Midwife

## 2016-09-17 DIAGNOSIS — Z1231 Encounter for screening mammogram for malignant neoplasm of breast: Secondary | ICD-10-CM

## 2017-02-26 ENCOUNTER — Encounter: Payer: Self-pay | Admitting: Certified Nurse Midwife

## 2017-02-26 ENCOUNTER — Other Ambulatory Visit (HOSPITAL_COMMUNITY)
Admission: RE | Admit: 2017-02-26 | Discharge: 2017-02-26 | Disposition: A | Discharge status: Home / Self Care | Payer: Managed Care, Other (non HMO) | Source: Ambulatory Visit | Attending: Obstetrics and Gynecology | Admitting: Obstetrics and Gynecology

## 2017-02-26 ENCOUNTER — Ambulatory Visit: Payer: Managed Care, Other (non HMO) | Admitting: Certified Nurse Midwife

## 2017-02-26 VITALS — BP 110/68 | HR 70 | Temp 97.4°F | Resp 16 | Ht 63.25 in | Wt 153.0 lb

## 2017-02-26 DIAGNOSIS — Z124 Encounter for screening for malignant neoplasm of cervix: Secondary | ICD-10-CM | POA: Diagnosis not present

## 2017-02-26 DIAGNOSIS — Z01419 Encounter for gynecological examination (general) (routine) without abnormal findings: Secondary | ICD-10-CM

## 2017-02-26 DIAGNOSIS — Z1211 Encounter for screening for malignant neoplasm of colon: Secondary | ICD-10-CM | POA: Diagnosis not present

## 2017-02-26 DIAGNOSIS — Z0000 Encounter for general adult medical examination without abnormal findings: Secondary | ICD-10-CM | POA: Diagnosis not present

## 2017-02-26 DIAGNOSIS — R319 Hematuria, unspecified: Secondary | ICD-10-CM

## 2017-02-26 LAB — POCT URINALYSIS DIPSTICK
BILIRUBIN UA: NEGATIVE
GLUCOSE UA: NEGATIVE
Ketones, UA: NEGATIVE
Nitrite, UA: NEGATIVE
Protein, UA: NEGATIVE
UROBILINOGEN UA: NEGATIVE U/dL — AB
pH, UA: 5 (ref 5.0–8.0)

## 2017-02-26 LAB — COMPREHENSIVE METABOLIC PANEL
ALBUMIN: 4.1 g/dL (ref 3.6–5.1)
ALT: 18 U/L (ref 6–29)
AST: 18 U/L (ref 10–35)
Alkaline Phosphatase: 51 U/L (ref 33–130)
BUN: 11 mg/dL (ref 7–25)
CHLORIDE: 105 mmol/L (ref 98–110)
CO2: 26 mmol/L (ref 20–31)
CREATININE: 0.69 mg/dL (ref 0.50–1.05)
Calcium: 9.1 mg/dL (ref 8.6–10.4)
Glucose, Bld: 90 mg/dL (ref 65–99)
POTASSIUM: 4 mmol/L (ref 3.5–5.3)
SODIUM: 141 mmol/L (ref 135–146)
TOTAL PROTEIN: 6.9 g/dL (ref 6.1–8.1)
Total Bilirubin: 0.5 mg/dL (ref 0.2–1.2)

## 2017-02-26 LAB — CBC
HCT: 40.9 % (ref 35.0–45.0)
Hemoglobin: 13.1 g/dL (ref 11.7–15.5)
MCH: 30.8 pg (ref 27.0–33.0)
MCHC: 32 g/dL (ref 32.0–36.0)
MCV: 96.2 fL (ref 80.0–100.0)
MPV: 9 fL (ref 7.5–12.5)
Platelets: 249 10*3/uL (ref 140–400)
RBC: 4.25 MIL/uL (ref 3.80–5.10)
RDW: 14.2 % (ref 11.0–15.0)
WBC: 5.4 10*3/uL (ref 3.8–10.8)

## 2017-02-26 LAB — LIPID PANEL
Cholesterol: 224 mg/dL — ABNORMAL HIGH (ref <200)
HDL: 50 mg/dL — AB (ref >50)
LDL CALC: 128 mg/dL — AB (ref <100)
TRIGLYCERIDES: 231 mg/dL — AB (ref <150)
Total CHOL/HDL Ratio: 4.5 Ratio (ref <5.0)
VLDL: 46 mg/dL — AB (ref <30)

## 2017-02-26 NOTE — Progress Notes (Signed)
56 y.o. G0P0000 Married  Caucasian Fe here for annual exam. Has had onset headaches and feels this may be related to contacts and glasses, with new ones now. Denies migraine headache or aura. Was seen for UTI earlier in year at Urgent care,had allergy to [place].  Occasional night urination but equates to water intake. Menopausal no HRT. Denies vaginal bleeding, some vaginal dryness. Denies urinary frequency or urgency or pain with urination today. Trying to increase water intake. No other health issues today. Screening labs if needed.   Patient's last menstrual period was 01/22/2015.          Sexually active: No.  The current method of family planning is post menopausal status.    Exercising: No.  exercise Smoker:  no  Health Maintenance: Pap:  02-18-15 neg HPV HR neg History of Abnormal Pap: yes MMG:  09-17-16 category c density birads 1:neg Self Breast exams: occ Colonoscopy:  none BMD:   none TDaP:  2010 Shingles: no Pneumonia: no Hep C and HIV: both neg 2017 Labs: poct urine-rbc tr, wbc tr   reports that she has never smoked. She has never used smokeless tobacco. She reports that she does not drink alcohol or use drugs.  Past Medical History:  Diagnosis Date  . Adenomyosis 1/11  . Cervical polyp   . Dysplasia of cervix, low grade (CIN 1) 1995    Past Surgical History:  Procedure Laterality Date  . CERVICAL POLYPECTOMY N/A 05/09/2015   Procedure: CERVICAL POLYPECTOMY WITH ENDOMETRIAL CURRETTINGS AND VAGINAL BIOPSY;  Surgeon: Megan Salon, MD;  Location: [facility];  Service: Gynecology;  Laterality: N/A;  . COLPOSCOPY    . FACIAL RECONSTRUCTION SURGERY    . HYSTEROSCOPY    . HYSTEROSCOPY W/D&C N/A 05/09/2015   Procedure: DILATATION AND CURETTAGE /HYSTEROSCOPY;  Surgeon: Megan Salon, MD;  Location: [facility];  Service: Gynecology;  Laterality: N/A;  . TONSILLECTOMY      Current Outpatient Prescriptions  Medication Sig Dispense Refill  . Acetaminophen (TYLENOL PO) Take by mouth  as needed.     No current facility-administered medications for this visit.     Family History  Problem Relation Age of Onset  . Diabetes Mother   . Heart disease Mother   . Hypertension Mother   . Heart disease Father        Psychologist, forensic  . Mental illness Brother     ROS:  Pertinent items are noted in HPI.  Otherwise, a comprehensive ROS was negative.  Exam:   BP 110/68   Pulse 70   Temp 97.4 F (36.3 C) (Oral)   Resp 16   Ht 5' 3.25" (1.607 m)   Wt 153 lb (69.4 kg)   LMP 01/22/2015   BMI 26.89 kg/m  Height: 5' 3.25" (160.7 cm) Ht Readings from Last 3 Encounters:  02/26/17 5' 3.25" (1.607 m)  02/22/16 5' 3.5" (1.613 m)  05/09/15 5\' 3"  (1.6 m)    General appearance: alert, cooperative and appears stated age Head: Normocephalic, without obvious abnormality, atraumatic Neck: no adenopathy, supple, symmetrical, trachea midline and thyroid normal to inspection and palpation Lungs: clear to auscultation bilaterally CVAT negative Breasts: normal appearance, no masses or tenderness, No nipple retraction or dimpling, No nipple discharge or bleeding, No axillary or supraclavicular adenopathy Heart: regular rate and rhythm Abdomen: soft, non-tender; no masses,  no organomegaly, negative suprapubic Extremities: extremities normal, atraumatic, no cyanosis or edema Skin: Skin color, texture, turgor normal. No rashes or lesions, warm and dry Lymph  nodes: Cervical, supraclavicular, and axillary nodes normal. No abnormal inguinal nodes palpated Neurologic: Grossly normal   Pelvic: External genitalia:  no lesions,  Normal female             Urethra:  normal appearing urethra with no masses, tenderness or lesions  Bladder and urethral meatus non tender              Bartholin's and Skene's: normal                 Vagina: normal appearing vagina with normal color and scant moisture or  discharge, no lesions              Cervix: no cervical motion tenderness, no lesions and normal  appearance              Pap taken: Yes.   Bimanual Exam:  Uterus:  normal size, contour, position, consistency, mobility, non-tender and positive finding of enlarged uterus 8-10 week size, known adenoyosis              Adnexa: normal adnexa and no mass, fullness, tenderness               Rectovaginal: Confirms               Anus:  normal sphincter tone, no lesions  Chaperone present: yes  A:  Well Woman with normal exam  Menopausal no HRT  Vaginal dryness  Uterine enlargement history of adenomyosis  Recent UTI treated, no symptoms today  R/O UTI   Screening labs  Colonoscopy due  P:   Reviewed health and wellness pertinent to exam  Aware of need to evaluate if vaginal bleeding  Discussed vaginal dryness and options for treatment with estrogen use or OTC options. Patient does not want to use estrogen unless needed. Discussed coconut oil option with instructions. Questions addressed. Will advise if no change.  Discussed uterine enlargement still present, but feels smaller, no concerns unless symptomatic.  Reviewed warning signs of UTI and need to advise.  Lab: Urine micro  Labs: CBC, CMP, Lipid panel, TSH, Vit. D  Reviewed risks and benefits of colonoscopy, declines scheduling today. IFOB dispensed.   Pap smear: yes   counseled on breast self exam, mammography screening, adequate intake of calcium and vitamin D, diet and exercise  return annually or prn  An After Visit Summary was printed and given to the patient.

## 2017-02-26 NOTE — Patient Instructions (Signed)

## 2017-02-27 ENCOUNTER — Other Ambulatory Visit: Payer: Self-pay | Admitting: Certified Nurse Midwife

## 2017-02-27 ENCOUNTER — Other Ambulatory Visit: Payer: Self-pay

## 2017-02-27 DIAGNOSIS — E559 Vitamin D deficiency, unspecified: Secondary | ICD-10-CM

## 2017-02-27 DIAGNOSIS — R899 Unspecified abnormal finding in specimens from other organs, systems and tissues: Secondary | ICD-10-CM

## 2017-02-27 LAB — URINALYSIS, MICROSCOPIC ONLY
BACTERIA UA: NONE SEEN [HPF]
CASTS: NONE SEEN [LPF]
CRYSTALS: NONE SEEN [HPF]
RBC / HPF: NONE SEEN RBC/HPF (ref <=2)
WBC UA: NONE SEEN WBC/HPF (ref <=5)
YEAST: NONE SEEN [HPF]

## 2017-02-27 LAB — VITAMIN D 25 HYDROXY (VIT D DEFICIENCY, FRACTURES): VIT D 25 HYDROXY: 27 ng/mL — AB (ref 30–100)

## 2017-02-27 LAB — TSH: TSH: 2.27 m[IU]/L

## 2017-02-28 LAB — CYTOLOGY - PAP: DIAGNOSIS: NEGATIVE

## 2017-05-30 ENCOUNTER — Other Ambulatory Visit (INDEPENDENT_AMBULATORY_CARE_PROVIDER_SITE_OTHER): Payer: Managed Care, Other (non HMO)

## 2017-05-30 DIAGNOSIS — E559 Vitamin D deficiency, unspecified: Secondary | ICD-10-CM

## 2017-05-30 NOTE — Addendum Note (Signed)
Addended by: Graylon Good on: 05/30/2017 01:58 PM   Modules accepted: Orders

## 2017-05-30 NOTE — Addendum Note (Signed)
Addended by: Charmayne Sheer on: 05/30/2017 02:05 PM   Modules accepted: Orders

## 2017-05-31 LAB — VITAMIN D 25 HYDROXY (VIT D DEFICIENCY, FRACTURES): VIT D 25 HYDROXY: 35.8 ng/mL (ref 30.0–100.0)

## 2017-08-28 ENCOUNTER — Other Ambulatory Visit: Payer: Self-pay | Admitting: Certified Nurse Midwife

## 2017-08-28 DIAGNOSIS — Z139 Encounter for screening, unspecified: Secondary | ICD-10-CM

## 2017-09-03 ENCOUNTER — Other Ambulatory Visit: Payer: Managed Care, Other (non HMO)

## 2017-09-17 ENCOUNTER — Other Ambulatory Visit: Payer: Managed Care, Other (non HMO)

## 2017-09-17 ENCOUNTER — Telehealth: Payer: Self-pay

## 2017-09-17 NOTE — Telephone Encounter (Signed)
appt scheduled for 09-19-17

## 2017-09-17 NOTE — Telephone Encounter (Signed)
Pt has lab appt for recheck cholesterol today at 1:30pm. This appt needs to be a fasting appt. Message left for patient to call & reschedule lab appt is she has not been fasting today.

## 2017-09-19 ENCOUNTER — Other Ambulatory Visit (INDEPENDENT_AMBULATORY_CARE_PROVIDER_SITE_OTHER): Payer: 59

## 2017-09-19 DIAGNOSIS — R899 Unspecified abnormal finding in specimens from other organs, systems and tissues: Secondary | ICD-10-CM

## 2017-09-20 LAB — LIPID PANEL
CHOL/HDL RATIO: 4.1 ratio (ref 0.0–4.4)
CHOLESTEROL TOTAL: 224 mg/dL — AB (ref 100–199)
HDL: 54 mg/dL (ref >39)
LDL Calculated: 146 mg/dL — ABNORMAL HIGH (ref 0–99)
TRIGLYCERIDES: 120 mg/dL (ref 0–149)
VLDL Cholesterol Cal: 24 mg/dL (ref 5–40)

## 2017-09-26 ENCOUNTER — Ambulatory Visit
Admission: RE | Admit: 2017-09-26 | Discharge: 2017-09-26 | Disposition: A | Discharge status: Home / Self Care | Payer: 59 | Source: Ambulatory Visit | Attending: Certified Nurse Midwife | Admitting: Certified Nurse Midwife

## 2017-09-26 DIAGNOSIS — Z139 Encounter for screening, unspecified: Secondary | ICD-10-CM

## 2018-02-27 ENCOUNTER — Ambulatory Visit: Payer: 59 | Admitting: Certified Nurse Midwife

## 2018-02-27 ENCOUNTER — Encounter: Payer: Self-pay | Admitting: Certified Nurse Midwife

## 2018-02-27 ENCOUNTER — Other Ambulatory Visit: Payer: Self-pay

## 2018-02-27 VITALS — BP 120/80 | HR 68 | Resp 16 | Ht 63.25 in | Wt 156.0 lb

## 2018-02-27 DIAGNOSIS — Z0000 Encounter for general adult medical examination without abnormal findings: Secondary | ICD-10-CM

## 2018-02-27 DIAGNOSIS — E559 Vitamin D deficiency, unspecified: Secondary | ICD-10-CM | POA: Diagnosis not present

## 2018-02-27 DIAGNOSIS — R6889 Other general symptoms and signs: Secondary | ICD-10-CM

## 2018-02-27 DIAGNOSIS — Z01419 Encounter for gynecological examination (general) (routine) without abnormal findings: Secondary | ICD-10-CM

## 2018-02-27 LAB — POCT URINALYSIS DIPSTICK
BILIRUBIN UA: NEGATIVE
Blood, UA: NEGATIVE
GLUCOSE UA: NEGATIVE
Ketones, UA: NEGATIVE
Leukocytes, UA: NEGATIVE
NITRITE UA: NEGATIVE
PROTEIN UA: NEGATIVE
Urobilinogen, UA: NEGATIVE E.U./dL — AB
pH, UA: 5 (ref 5.0–8.0)

## 2018-02-27 NOTE — Progress Notes (Signed)
57 y.o. G0P0000 Married  Caucasian Fe here for annual exam. Denies vaginal bleeding. Still having vaginal dryness, but not sexually active. Busy with her animals. Had URI vs allergies this year with some oral yeast in mouth. Also has had dry mouth but using OTC relief with good results. Screening labs desired. No other health issues today. Uses Urgent care if needed.    Patient's last menstrual period was 01/22/2015.          Sexually active: No.  The current method of family planning is post menopausal status.    Exercising: Yes.    walking Smoker:  no  Health Maintenance: Pap:  02-18-15 neg HPV HR neg, 02-26-17 neg History of Abnormal Pap: yes MMG:  09-26-17 category c density birads 1:neg Self Breast exams: occ Colonoscopy: none BMD:   none TDaP:  2010, declines updates Shingles: no Pneumonia: no Hep C and HIV: both neg 2017 Labs: poct urine-neg   reports that she has never smoked. She has never used smokeless tobacco. She reports that she does not drink alcohol or use drugs.  Past Medical History:  Diagnosis Date  . Adenomyosis 1/11  . Cervical polyp   . Dysplasia of cervix, low grade (CIN 1) 1995    Past Surgical History:  Procedure Laterality Date  . CERVICAL POLYPECTOMY N/A 05/09/2015   Procedure: CERVICAL POLYPECTOMY WITH ENDOMETRIAL CURRETTINGS AND VAGINAL BIOPSY;  Surgeon: Megan Salon, MD;  Location: [facility];  Service: Gynecology;  Laterality: N/A;  . COLPOSCOPY    . FACIAL RECONSTRUCTION SURGERY    . HYSTEROSCOPY    . HYSTEROSCOPY W/D&C N/A 05/09/2015   Procedure: DILATATION AND CURETTAGE /HYSTEROSCOPY;  Surgeon: Megan Salon, MD;  Location: [facility];  Service: Gynecology;  Laterality: N/A;  . TONSILLECTOMY      Current Outpatient Medications  Medication Sig Dispense Refill  . Acetaminophen (TYLENOL PO) Take by mouth as needed.     No current facility-administered medications for this visit.     Family History  Problem Relation Age of Onset  . Diabetes Mother    . Heart disease Mother   . Hypertension Mother   . Heart disease Father        Psychologist, forensic  . Mental illness Brother   . Breast cancer Neg Hx     ROS:  Pertinent items are noted in HPI.  Otherwise, a comprehensive ROS was negative.  Exam:   LMP 01/22/2015    Ht Readings from Last 3 Encounters:  02/26/17 5' 3.25" (1.607 m)  02/22/16 5' 3.5" (1.613 m)  05/09/15 5\' 3"  (1.6 m)    General appearance: alert, cooperative and appears stated age Head: Normocephalic, without obvious abnormality, atraumatic Neck: no adenopathy, supple, symmetrical, trachea midline and thyroid normal to inspection and palpation Lungs: clear to auscultation bilaterally Breasts: normal appearance, no masses or tenderness, No nipple retraction or dimpling, No nipple discharge or bleeding, No axillary or supraclavicular adenopathy Heart: regular rate and rhythm Abdomen: soft, non-tender; no masses,  no organomegaly Extremities: extremities normal, atraumatic, no cyanosis or edema Skin: Skin color, texture, turgor normal. No rashes or lesions Lymph nodes: Cervical, supraclavicular, and axillary nodes normal. No abnormal inguinal nodes palpated Neurologic: Grossly normal   Pelvic: External genitalia:  no lesions              Urethra:  normal appearing urethra with no masses, tenderness or lesions              Bartholin's and Skene's: normal  Vagina: normal appearing vagina with normal color and discharge, no lesions              Cervix: no cervical motion tenderness, no lesions and normal appearacne              Pap taken: No. Bimanual Exam:  Uterus:  normal size, contour, position, consistency, mobility, non-tender and anteverted              Adnexa: normal adnexa and no mass, fullness, tenderness               Rectovaginal: Confirms               Anus:  normal sphincter tone, no lesions  Chaperone present: yes  A:  Well Woman with normal exam  Menopausal no HRT  Colonoscopy  due  Screening labs  P:   Reviewed health and wellness pertinent to exam  Aware of need to advise if vaginal bleeding  Discussed risks/benefits of colonoscopy and cologard use. Patient declines scheduling at this time, but given Cologard information and will advise if desires to proceed with doing it. Aware if positive would need colonoscopy.  Labs: CMP,Lipid panel, CBC, TSH, Vitamin D  Pap smear: no  counseled on breast self exam, mammography screening, feminine hygiene, adequate intake of calcium and vitamin D, diet and exercise  return annually or prn  An After Visit Summary was printed and given to the patient.

## 2018-02-27 NOTE — Patient Instructions (Signed)

## 2018-02-28 LAB — LIPID PANEL
CHOLESTEROL TOTAL: 241 mg/dL — AB (ref 100–199)
Chol/HDL Ratio: 4.2 ratio (ref 0.0–4.4)
HDL: 57 mg/dL (ref >39)
LDL CALC: 164 mg/dL — AB (ref 0–99)
Triglycerides: 100 mg/dL (ref 0–149)
VLDL Cholesterol Cal: 20 mg/dL (ref 5–40)

## 2018-02-28 LAB — COMPREHENSIVE METABOLIC PANEL
A/G RATIO: 1.6 (ref 1.2–2.2)
ALK PHOS: 59 IU/L (ref 39–117)
ALT: 21 IU/L (ref 0–32)
AST: 24 IU/L (ref 0–40)
Albumin: 4.5 g/dL (ref 3.5–5.5)
BILIRUBIN TOTAL: 0.6 mg/dL (ref 0.0–1.2)
BUN/Creatinine Ratio: 16 (ref 9–23)
BUN: 13 mg/dL (ref 6–24)
CHLORIDE: 102 mmol/L (ref 96–106)
CO2: 26 mmol/L (ref 20–29)
Calcium: 9.6 mg/dL (ref 8.7–10.2)
Creatinine, Ser: 0.8 mg/dL (ref 0.57–1.00)
GFR calc Af Amer: 95 mL/min/{1.73_m2} (ref >59)
GFR, EST NON AFRICAN AMERICAN: 83 mL/min/{1.73_m2} (ref >59)
GLOBULIN, TOTAL: 2.9 g/dL (ref 1.5–4.5)
Glucose: 87 mg/dL (ref 65–99)
POTASSIUM: 4.1 mmol/L (ref 3.5–5.2)
Sodium: 142 mmol/L (ref 134–144)
Total Protein: 7.4 g/dL (ref 6.0–8.5)

## 2018-02-28 LAB — CBC
HEMATOCRIT: 41.5 % (ref 34.0–46.6)
HEMOGLOBIN: 13.6 g/dL (ref 11.1–15.9)
MCH: 31.2 pg (ref 26.6–33.0)
MCHC: 32.8 g/dL (ref 31.5–35.7)
MCV: 95 fL (ref 79–97)
Platelets: 238 10*3/uL (ref 150–450)
RBC: 4.36 x10E6/uL (ref 3.77–5.28)
RDW: 14 % (ref 12.3–15.4)
WBC: 7 10*3/uL (ref 3.4–10.8)

## 2018-02-28 LAB — VITAMIN D 25 HYDROXY (VIT D DEFICIENCY, FRACTURES): VIT D 25 HYDROXY: 32 ng/mL (ref 30.0–100.0)

## 2018-02-28 LAB — TSH: TSH: 1.76 u[IU]/mL (ref 0.450–4.500)

## 2018-09-15 ENCOUNTER — Other Ambulatory Visit: Payer: Self-pay | Admitting: Certified Nurse Midwife

## 2018-09-15 DIAGNOSIS — Z1231 Encounter for screening mammogram for malignant neoplasm of breast: Secondary | ICD-10-CM

## 2018-10-06 ENCOUNTER — Ambulatory Visit
Admission: RE | Admit: 2018-10-06 | Discharge: 2018-10-06 | Disposition: A | Discharge status: Home / Self Care | Payer: 59 | Source: Ambulatory Visit | Attending: Certified Nurse Midwife | Admitting: Certified Nurse Midwife

## 2018-10-06 DIAGNOSIS — Z1231 Encounter for screening mammogram for malignant neoplasm of breast: Secondary | ICD-10-CM

## 2019-03-03 ENCOUNTER — Ambulatory Visit: Payer: 59 | Admitting: Certified Nurse Midwife

## 2019-04-17 ENCOUNTER — Other Ambulatory Visit: Payer: Self-pay

## 2019-04-20 ENCOUNTER — Other Ambulatory Visit (HOSPITAL_COMMUNITY)
Admission: RE | Admit: 2019-04-20 | Discharge: 2019-04-20 | Disposition: A | Discharge status: Home / Self Care | Payer: 59 | Source: Ambulatory Visit | Attending: Certified Nurse Midwife | Admitting: Certified Nurse Midwife

## 2019-04-20 ENCOUNTER — Other Ambulatory Visit: Payer: Self-pay

## 2019-04-20 ENCOUNTER — Encounter: Payer: Self-pay | Admitting: Certified Nurse Midwife

## 2019-04-20 ENCOUNTER — Ambulatory Visit: Payer: 59 | Admitting: Certified Nurse Midwife

## 2019-04-20 VITALS — BP 120/78 | HR 70 | Temp 97.4°F | Resp 16 | Ht 63.25 in | Wt 148.0 lb

## 2019-04-20 DIAGNOSIS — E559 Vitamin D deficiency, unspecified: Secondary | ICD-10-CM

## 2019-04-20 DIAGNOSIS — Z124 Encounter for screening for malignant neoplasm of cervix: Secondary | ICD-10-CM

## 2019-04-20 DIAGNOSIS — Z0000 Encounter for general adult medical examination without abnormal findings: Secondary | ICD-10-CM

## 2019-04-20 DIAGNOSIS — Z01419 Encounter for gynecological examination (general) (routine) without abnormal findings: Secondary | ICD-10-CM

## 2019-04-20 DIAGNOSIS — N950 Postmenopausal bleeding: Secondary | ICD-10-CM

## 2019-04-20 NOTE — Progress Notes (Signed)
58 y.o. G0P0000 Married  Caucasian Fe here for annual exam. Menopausal hot flashes have subsided. No vaginal dryness. Noted brown/red discharge that felt like period symptoms with one drop in underwear 8 months ago. Has had none since then. Had TDAP with fever and reaction per patient. Sees PCP prn. Needs labs today.  Patient's last menstrual period was 01/22/2015.          Sexually active: No.  The current method of family planning is post menopausal status.    Exercising: Yes.    walking, weights Smoker:  no  Review of Systems  Constitutional: Negative.   HENT: Negative.   Eyes: Negative.   Respiratory: Negative.   Cardiovascular: Negative.   Gastrointestinal: Negative.   Genitourinary: Negative.   Musculoskeletal: Negative.   Skin: Negative.   Neurological: Negative.   Endo/Heme/Allergies: Negative.   Psychiatric/Behavioral: Negative.     Health Maintenance: Pap:  02-26-17 neg History of Abnormal Pap: yes MMG:  10-06-18 category c density birads 1:neg Self Breast exams: occ Colonoscopy:  none BMD:   none TDaP:  2010, pt had reaction Shingles: no Pneumonia: no Hep C and HIV: both neg 2017 Labs:    reports that she has never smoked. She has never used smokeless tobacco. She reports that she does not drink alcohol or use drugs.  Past Medical History:  Diagnosis Date  . Adenomyosis 1/11  . Cervical polyp   . Dysplasia of cervix, low grade (CIN 1) 1995    Past Surgical History:  Procedure Laterality Date  . CERVICAL POLYPECTOMY N/A 05/09/2015   Procedure: CERVICAL POLYPECTOMY WITH ENDOMETRIAL CURRETTINGS AND VAGINAL BIOPSY;  Surgeon: Megan Salon, MD;  Location: [facility];  Service: Gynecology;  Laterality: N/A;  . COLPOSCOPY    . FACIAL RECONSTRUCTION SURGERY    . HYSTEROSCOPY    . HYSTEROSCOPY W/D&C N/A 05/09/2015   Procedure: DILATATION AND CURETTAGE /HYSTEROSCOPY;  Surgeon: Megan Salon, MD;  Location: [facility];  Service: Gynecology;  Laterality: N/A;  . TONSILLECTOMY       Current Outpatient Medications  Medication Sig Dispense Refill  . Acetaminophen (TYLENOL PO) Take by mouth as needed.    . Cholecalciferol (VITAMIN D PO) Take by mouth.    . fluticasone (FLONASE) 50 MCG/ACT nasal spray Place into both nostrils daily.    Marland Kitchen loratadine (CLARITIN) 10 MG tablet Take 10 mg by mouth daily.    . Multiple Vitamins-Minerals (MULTIVITAMIN PO) Take by mouth.    . Omega-3 Fatty Acids (FISH OIL PO) Take by mouth.    . Pseudoephedrine HCl (SUDAFED PO) Take by mouth.     No current facility-administered medications for this visit.     Family History  Problem Relation Age of Onset  . Diabetes Mother   . Heart disease Mother   . Hypertension Mother   . Heart disease Father        Psychologist, forensic  . Mental illness Brother   . Breast cancer Neg Hx     ROS:  Pertinent items are noted in HPI.  Otherwise, a comprehensive ROS was negative.  Exam:   BP 120/78   Pulse 70   Temp (!) 97.4 F (36.3 C) (Skin)   Resp 16   Ht 5' 3.25" (1.607 m)   Wt 148 lb (67.1 kg)   LMP 01/22/2015 Comment: drop of blood 34mth ago  BMI 26.01 kg/m  Height: 5' 3.25" (160.7 cm) Ht Readings from Last 3 Encounters:  04/20/19 5' 3.25" (1.607 m)  02/27/18 5'  3.25" (1.607 m)  02/26/17 5' 3.25" (1.607 m)    General appearance: alert, cooperative and appears stated age Head: Normocephalic, without obvious abnormality, atraumatic Neck: no adenopathy, supple, symmetrical, trachea midline and thyroid normal to inspection and palpation Lungs: clear to auscultation bilaterally Breasts: normal appearance, no masses or tenderness, No nipple retraction or dimpling, No nipple discharge or bleeding, No axillary or supraclavicular adenopathy Heart: regular rate and rhythm Abdomen: soft, non-tender; no masses,  no organomegaly Extremities: extremities normal, atraumatic, no cyanosis or edema Skin: Skin color, texture, turgor normal. No rashes or lesions Lymph nodes: Cervical, supraclavicular, and  axillary nodes normal. No abnormal inguinal nodes palpated Neurologic: Grossly normal   Pelvic: External genitalia:  no lesions, cracking on left external vulva in crease area only. Non tender. Patient not aware.              Urethra:  normal appearing urethra with no masses, tenderness or lesions              Bartholin's and Skene's: normal                 Vagina: normal appearing vagina with normal color and discharge, no lesions              Cervix: no bleeding following Pap, no cervical motion tenderness, no lesions and normal appearance              Pap taken: Yes.   Bimanual Exam:  Uterus:  normal size, contour, position, consistency, mobility, non-tender and anteverted              Adnexa: normal adnexa and no mass, fullness, tenderness               Rectovaginal: Confirms               Anus:  normal sphincter tone, no lesions  Chaperone present: yes  A:  Well Woman with normal exam  Menopausal with ? PMB once drop only  Vulva dryness  Colonoscopy due  Immunization update  Screening labs  P:   Reviewed health and wellness pertinent to exam  Discussed if sees anymore needs to call. Will await pap smear and determine if PUS needed.  Shown area in mirror of dryness, instructed to use coconut oil to area for healing and prevention of dryness  Discussed risks/benefits of colonoscopy. Desires referral. She will be called with appointment information.  Will need to go to pharmacy for tetanus only immunization  Labs: Lipid panel, CMP, TSH,CBC, Vitamin D  Pap smear: yes   counseled on breast self exam, mammography screening, feminine hygiene, adequate intake of calcium and vitamin D, diet and exercise, Kegel's exercises  return annually or prn  An After Visit Summary was printed and given to the patient.

## 2019-04-21 ENCOUNTER — Telehealth: Payer: Self-pay

## 2019-04-21 LAB — LIPID PANEL
Chol/HDL Ratio: 3.8 ratio (ref 0.0–4.4)
Cholesterol, Total: 221 mg/dL — ABNORMAL HIGH (ref 100–199)
HDL: 58 mg/dL (ref >39)
LDL Calculated: 141 mg/dL — ABNORMAL HIGH (ref 0–99)
Triglycerides: 110 mg/dL (ref 0–149)
VLDL Cholesterol Cal: 22 mg/dL (ref 5–40)

## 2019-04-21 LAB — COMPREHENSIVE METABOLIC PANEL
ALT: 16 IU/L (ref 0–32)
AST: 18 IU/L (ref 0–40)
Albumin/Globulin Ratio: 1.8 (ref 1.2–2.2)
Albumin: 4.2 g/dL (ref 3.8–4.9)
Alkaline Phosphatase: 50 IU/L (ref 39–117)
BUN/Creatinine Ratio: 12 (ref 9–23)
BUN: 9 mg/dL (ref 6–24)
Bilirubin Total: 0.4 mg/dL (ref 0.0–1.2)
CO2: 26 mmol/L (ref 20–29)
Calcium: 9.2 mg/dL (ref 8.7–10.2)
Chloride: 103 mmol/L (ref 96–106)
Creatinine, Ser: 0.76 mg/dL (ref 0.57–1.00)
GFR calc Af Amer: 101 mL/min/{1.73_m2} (ref >59)
GFR calc non Af Amer: 87 mL/min/{1.73_m2} (ref >59)
Globulin, Total: 2.4 g/dL (ref 1.5–4.5)
Glucose: 86 mg/dL (ref 65–99)
Potassium: 4.3 mmol/L (ref 3.5–5.2)
Sodium: 143 mmol/L (ref 134–144)
Total Protein: 6.6 g/dL (ref 6.0–8.5)

## 2019-04-21 LAB — CBC
Hematocrit: 40.7 % (ref 34.0–46.6)
Hemoglobin: 13.2 g/dL (ref 11.1–15.9)
MCH: 32.2 pg (ref 26.6–33.0)
MCHC: 32.4 g/dL (ref 31.5–35.7)
MCV: 99 fL — ABNORMAL HIGH (ref 79–97)
Platelets: 245 10*3/uL (ref 150–450)
RBC: 4.1 x10E6/uL (ref 3.77–5.28)
RDW: 13.2 % (ref 11.7–15.4)
WBC: 8.3 10*3/uL (ref 3.4–10.8)

## 2019-04-21 LAB — VITAMIN D 25 HYDROXY (VIT D DEFICIENCY, FRACTURES): Vit D, 25-Hydroxy: 32.5 ng/mL (ref 30.0–100.0)

## 2019-04-21 LAB — TSH: TSH: 1.83 u[IU]/mL (ref 0.450–4.500)

## 2019-04-21 NOTE — Telephone Encounter (Signed)
Left message for call back.

## 2019-04-21 NOTE — Telephone Encounter (Signed)
-----   Message from Regina Eck, CNM sent at 04/21/2019 10:12 AM EDT ----- Notify patient her Vitamin D is borderline normal. Needs to be more in the 40-50 range with menopause for bone support. How much is taking, she can do 2000 IU Vitamin D 3 daily, if she has only been using 1000 IU daily TSH is normal Lipid panel has improved. Cholesterol is 221 from 241 last year Triglycerides normal at 110 HDL maintaining good range at 58 LDL has improved 141 from 164 Risk ratio improved from 4.2 to 3.8 Liver, kidney and glucose profile normal CBC is normal Pap pending

## 2019-04-22 NOTE — Telephone Encounter (Signed)
Attempted times 2 to give patient her lab results & her phone kept going out & it cut off.

## 2019-04-23 LAB — CYTOLOGY - PAP
Diagnosis: NEGATIVE
HPV: NOT DETECTED

## 2019-04-23 NOTE — Telephone Encounter (Signed)
Return call to Joy. °

## 2019-04-24 NOTE — Telephone Encounter (Signed)
Returned call to patient. Patient notified of all results and verbalized understanding. Patient aware to increase vitamin D to 2,000 IU daily.   Encounter closed.

## 2019-10-20 ENCOUNTER — Other Ambulatory Visit: Payer: Self-pay | Admitting: Certified Nurse Midwife

## 2019-10-20 DIAGNOSIS — Z1231 Encounter for screening mammogram for malignant neoplasm of breast: Secondary | ICD-10-CM

## 2019-11-27 ENCOUNTER — Other Ambulatory Visit: Payer: Self-pay

## 2019-11-27 ENCOUNTER — Ambulatory Visit
Admission: RE | Admit: 2019-11-27 | Discharge: 2019-11-27 | Disposition: A | Discharge status: Home / Self Care | Payer: Managed Care, Other (non HMO) | Source: Ambulatory Visit | Attending: Certified Nurse Midwife | Admitting: Certified Nurse Midwife

## 2019-11-27 DIAGNOSIS — Z1231 Encounter for screening mammogram for malignant neoplasm of breast: Secondary | ICD-10-CM

## 2019-11-27 NOTE — Progress Notes (Signed)
Mammogram negative Density C Repeat yearly

## 2019-12-30 ENCOUNTER — Encounter: Payer: Self-pay | Admitting: Certified Nurse Midwife

## 2020-04-01 ENCOUNTER — Telehealth: Payer: Self-pay | Admitting: Obstetrics & Gynecology

## 2020-04-01 NOTE — Telephone Encounter (Signed)
Spoke with pt. Pt given recommendations per Dr Sabra Heck. Pt have to OV . Pt agreeable. Pt scheduled with Dr Talbert Nan at 1pm on 04/05/20 for vaginal dryness due to Dr Sabra Heck out of office. Pt agreeable and verbalized understanding of date and time of appt. CPS neg.   Routing to Dr Talbert Nan for review. Cc: Dr Sabra Heck  Encounter closed.

## 2020-04-01 NOTE — Telephone Encounter (Signed)
AEX 04/2019 with DL Next scheduled AEX 06/20/20 with SM  H/O vaginal dryness  Menopausal, no HRT   Spoke with pt. Pt states having vaginal dryness and noticed today having some vaginal redness. Pt denies vaginal bleeding, discharge, odor, UTI sx. Pt states was instructed to use coconut oil and vasoeline to help per Jackelyn Poling, CNM at past AEX. Pt states used coconut or vaseline nightly. Pt feels like no longer working. Pt states also having hot flashes with night sweats x 1 year now that have not increased or changed since last AEX. Pt stable at this time and does not want to use HRT.   Advised will review with Dr Sabra Heck on recommendations and advice and return call to pt. Pt agreeable.   Routing to Dr Sabra Heck.

## 2020-04-01 NOTE — Telephone Encounter (Signed)
Patient is having vaginal dryness and redness.

## 2020-04-01 NOTE — Telephone Encounter (Signed)
I would recommend an office visit.  Thanks.

## 2020-04-01 NOTE — Telephone Encounter (Signed)
Left message for pt to return call to triage RN. 

## 2020-04-05 ENCOUNTER — Other Ambulatory Visit: Payer: Self-pay

## 2020-04-05 ENCOUNTER — Encounter: Payer: Self-pay | Admitting: Obstetrics and Gynecology

## 2020-04-05 ENCOUNTER — Ambulatory Visit (INDEPENDENT_AMBULATORY_CARE_PROVIDER_SITE_OTHER): Payer: 59 | Admitting: Obstetrics and Gynecology

## 2020-04-05 VITALS — BP 128/72 | HR 70 | Temp 97.7°F | Ht 63.25 in | Wt 146.0 lb

## 2020-04-05 DIAGNOSIS — N952 Postmenopausal atrophic vaginitis: Secondary | ICD-10-CM

## 2020-04-05 DIAGNOSIS — N9089 Other specified noninflammatory disorders of vulva and perineum: Secondary | ICD-10-CM | POA: Diagnosis not present

## 2020-04-05 DIAGNOSIS — N898 Other specified noninflammatory disorders of vagina: Secondary | ICD-10-CM

## 2020-04-05 MED ORDER — ESTRADIOL 10 MCG VA TABS: ORAL_TABLET | VAGINAL | 0 refills | Status: DC

## 2020-04-05 NOTE — Patient Instructions (Signed)
Atrophic Vaginitis  Atrophic vaginitis is a condition in which the tissues that line the vagina become dry and thin. This condition is most common in women who have stopped having regular menstrual periods (are in menopause). This usually starts when a woman is 45-59 years old. That is the time when a woman's estrogen levels begin to drop (decrease). Estrogen is a female hormone. It helps to keep the tissues of the vagina moist. It stimulates the vagina to produce a clear fluid that lubricates the vagina for sexual intercourse. This fluid also protects the vagina from infection. Lack of estrogen can cause the lining of the vagina to get thinner and dryer. The vagina may also shrink in size. It may become less elastic. Atrophic vaginitis tends to get worse over time as a woman's estrogen level drops. What are the causes? This condition is caused by the normal drop in estrogen that happens around the time of menopause. What increases the risk? Certain conditions or situations may lower a woman's estrogen level, leading to a higher risk for atrophic vaginitis. You are more likely to develop this condition if:  You are taking medicines that block estrogen.  You have had your ovaries removed.  You are being treated for cancer with X-ray (radiation) or medicines (chemotherapy).  You have given birth or are breastfeeding.  You are older than age 50.  You smoke. What are the signs or symptoms? Symptoms of this condition include:  Pain, soreness, or bleeding during sexual intercourse (dyspareunia).  Vaginal burning, irritation, or itching.  Pain or bleeding when a speculum is used in a vaginal exam (pelvic exam).  Having burning pain when passing urine.  Vaginal discharge that is brown or yellow. In some cases, there are no symptoms. How is this diagnosed? This condition is diagnosed by taking a medical history and doing a physical exam. This will include a pelvic exam that checks the  vaginal tissues. Though rare, you may also have other tests, including:  A urine test.  A test that checks the acid balance in your vagina (acid balance test). How is this treated? Treatment for this condition depends on how severe your symptoms are. Treatment may include:  Using an over-the-counter vaginal lubricant before sex.  Using a long-acting vaginal moisturizer.  Using low-dose vaginal estrogen for moderate to severe symptoms that do not respond to other treatments. Options include creams, tablets, and inserts (vaginal rings). Before you use a vaginal estrogen, tell your health care provider if you have a history of: ? Breast cancer. ? Endometrial cancer. ? Blood clots. If you are not sexually active and your symptoms are very mild, you may not need treatment. Follow these instructions at home: Medicines  Take over-the-counter and prescription medicines only as told by your health care provider. Do not use herbal or alternative medicines unless your health care provider says that you can.  Use over-the-counter creams, lubricants, or moisturizers for dryness only as directed by your health care provider. General instructions  If your atrophic vaginitis is caused by menopause, discuss all of your menopause symptoms and treatment options with your health care provider.  Do not douche.  Do not use products that can make your vagina dry. These include: ? Scented feminine sprays. ? Scented tampons. ? Scented soaps.  Vaginal intercourse can help to improve blood flow and elasticity of vaginal tissue. If it hurts to have sex, try using a lubricant or moisturizer just before having intercourse. Contact a health care provider if:    Your discharge looks different than normal.  Your vagina has an unusual smell.  You have new symptoms.  Your symptoms do not improve with treatment.  Your symptoms get worse. Summary  Atrophic vaginitis is a condition in which the tissues that  line the vagina become dry and thin. It is most common in women who have stopped having regular menstrual periods (are in menopause).  Treatment options include using vaginal lubricants and low-dose vaginal estrogen.  Contact a health care provider if your vagina has an unusual smell, or if your symptoms get worse or do not improve after treatment. This information is not intended to replace advice given to you by your health care provider. Make sure you discuss any questions you have with your health care provider. Document Revised: 09/06/2017 Document Reviewed: 06/20/2017 Elsevier Patient Education  2020 Elsevier Inc.  

## 2020-04-05 NOTE — Progress Notes (Signed)
GYNECOLOGY  VISIT   HPI: 59 y.o.   Married White or Caucasian Not Hispanic or Latino  female   G0P0000 with Patient's last menstrual period was 01/22/2015.   here for Vaginal/vulvar dryness and redness. No itching, no discharge, slight burning. She has noticed this over the last year, uses coconut oil or Vaseline intermittently. Made it less red, less uncomfortable. Not sexually active.   GYNECOLOGIC HISTORY: Patient's last menstrual period was 01/22/2015. Contraception:NA Menopausal hormone therapy: none         OB History    Gravida  0   Para  0   Term  0   Preterm  0   AB  0   Living  0     SAB  0   TAB  0   Ectopic  0   Multiple  0   Live Births                 Patient Active Problem List   Diagnosis Date Noted  . Enlarged uterus 03/04/2015  . Adenomyosis 03/04/2015  . Left wrist pain 12/16/2014    Past Medical History:  Diagnosis Date  . Adenomyosis 1/11  . Cervical polyp   . Dysplasia of cervix, low grade (CIN 1) 1995    Past Surgical History:  Procedure Laterality Date  . CERVICAL POLYPECTOMY N/A 05/09/2015   Procedure: CERVICAL POLYPECTOMY WITH ENDOMETRIAL CURRETTINGS AND VAGINAL BIOPSY;  Surgeon: Megan Salon, MD;  Location: [facility];  Service: Gynecology;  Laterality: N/A;  . COLPOSCOPY    . FACIAL RECONSTRUCTION SURGERY    . HYSTEROSCOPY    . HYSTEROSCOPY WITH D & C N/A 05/09/2015   Procedure: DILATATION AND CURETTAGE /HYSTEROSCOPY;  Surgeon: Megan Salon, MD;  Location: [facility];  Service: Gynecology;  Laterality: N/A;  . TONSILLECTOMY      Current Outpatient Medications  Medication Sig Dispense Refill  . Acetaminophen (TYLENOL PO) Take by mouth as needed.    . B Complex Vitamins (B COMPLEX PO) Take by mouth.    . Cholecalciferol (VITAMIN D PO) Take by mouth.    . Coenzyme Q10 (COQ10 PO) Take by mouth.    . loratadine (CLARITIN) 10 MG tablet Take 10 mg by mouth daily.    . Omega-3 Fatty Acids (FISH OIL PO) Take by mouth.    .  Pseudoephedrine HCl (SUDAFED PO) Take by mouth.     No current facility-administered medications for this visit.     ALLERGIES: Macrobid [nitrofurantoin monohyd macro], Sulfa antibiotics, Latex, and Tdap [tetanus-diphth-acell pertussis]  Family History  Problem Relation Age of Onset  . Diabetes Mother   . Heart disease Mother   . Hypertension Mother   . Heart disease Father        Psychologist, forensic  . Mental illness Brother   . Breast cancer Neg Hx     Social History   Socioeconomic History  . Marital status: Married    Spouse name: Not on file  . Number of children: Not on file  . Years of education: Not on file  . Highest education level: Not on file  Occupational History  . Not on file  Tobacco Use  . Smoking status: Never Smoker  . Smokeless tobacco: Never Used  Substance and Sexual Activity  . Alcohol use: No    Alcohol/week: 0.0 standard drinks  . Drug use: No  . Sexual activity: Not Currently    Partners: Male    Birth control/protection: Post-menopausal  Other Topics  Concern  . Not on file  Social History Narrative  . Not on file   Social Determinants of Health   Financial Resource Strain:   . Difficulty of Paying Living Expenses:   Food Insecurity:   . Worried About Charity fundraiser in the Last Year:   . Arboriculturist in the Last Year:   Transportation Needs:   . Film/video editor (Medical):   Marland Kitchen Lack of Transportation (Non-Medical):   Physical Activity:   . Days of Exercise per Week:   . Minutes of Exercise per Session:   Stress:   . Feeling of Stress :   Social Connections:   . Frequency of Communication with Friends and Family:   . Frequency of Social Gatherings with Friends and Family:   . Attends Religious Services:   . Active Member of Clubs or Organizations:   . Attends Archivist Meetings:   Marland Kitchen Marital Status:   Intimate Partner Violence:   . Fear of Current or Ex-Partner:   . Emotionally Abused:   Marland Kitchen Physically Abused:    . Sexually Abused:     Review of Systems  Genitourinary:       Vaginal dryness  All other systems reviewed and are negative.   PHYSICAL EXAMINATION:    BP 128/72   Pulse 70   Temp 97.7 F (36.5 C)   Ht 5' 3.25" (1.607 m)   Wt 146 lb (66.2 kg)   LMP 01/22/2015 Comment: drop of blood 38mth ago  SpO2 100%   BMI 25.66 kg/m     General appearance: alert, cooperative and appears stated age   Pelvic: External genitalia:  no lesions, mild atrophy              Urethra:  normal appearing urethra with no masses, tenderness or lesions              Bartholins and Skenes: normal                 Vagina: atrophic appearing vagina with normal color and discharge, no lesions              Cervix: no lesions              Bimanual Exam:  Uterus:  normal size, contour, position, consistency, mobility, non-tender              Adnexa: no mass, fullness, tenderness                Chaperone was present for exam.  ASSESSMENT Vaginal and vulvar irritation Vaginal and vulvar atrophy    PLAN Vaginitis panel sent Start vaginal estrogen  Information on vulvar skin care given.     An After Visit Summary was printed and given to the patient.

## 2020-04-07 LAB — NUSWAB BV AND CANDIDA, NAA
Candida albicans, NAA: NEGATIVE
Candida glabrata, NAA: NEGATIVE

## 2020-04-22 ENCOUNTER — Ambulatory Visit: Payer: 59 | Admitting: Certified Nurse Midwife

## 2020-06-17 NOTE — Progress Notes (Signed)
59 y.o. G0P0000 Married White or Caucasian female here for annual exam.  Denies vaginal bleeding.  Does have some hot flashes.  Does have some issues with sleep.      Did not have Covid vaccination.  Not interested.  Declines vaginal bleeding.    Patient's last menstrual period was 01/22/2015.          Sexually active: No.  The current method of family planning is post menopausal status.    Exercising: No.  exercise Smoker:  no  Health Maintenance: Pap:  02-26-17 neg, 04-20-2019 neg HPV HR neg History of abnormal Pap:  Yes, h/o CIN 1 1995 MMG:  11-27-2019 category c density birads 1:neg Colonoscopy:  None.  D/w pt today.  Willing to be referred as long as does not have to have done in the hospital. BMD:   none TDaP:  2010 had reaction Pneumonia vaccine(s):  no Shingrix:   no Hep C testing: neg 2017 Screening Labs: 04/2019   reports that she has never smoked. She has never used smokeless tobacco. She reports that she does not drink alcohol and does not use drugs.  Past Medical History:  Diagnosis Date   Adenomyosis 1/11   Cervical polyp    Dysplasia of cervix, low grade (CIN 1) 1995    Past Surgical History:  Procedure Laterality Date   CERVICAL POLYPECTOMY N/A 05/09/2015   Procedure: CERVICAL POLYPECTOMY WITH ENDOMETRIAL CURRETTINGS AND VAGINAL BIOPSY;  Surgeon: Megan Salon, MD;  Location: [facility];  Service: Gynecology;  Laterality: N/A;   COLPOSCOPY     FACIAL RECONSTRUCTION SURGERY     HYSTEROSCOPY     HYSTEROSCOPY WITH D & C N/A 05/09/2015   Procedure: DILATATION AND CURETTAGE /HYSTEROSCOPY;  Surgeon: Megan Salon, MD;  Location: [facility];  Service: Gynecology;  Laterality: N/A;   TONSILLECTOMY      Current Outpatient Medications  Medication Sig Dispense Refill   Acetaminophen (TYLENOL PO) Take by mouth as needed.     B Complex Vitamins (B COMPLEX PO) Take by mouth.     Cholecalciferol (VITAMIN D PO) Take by mouth.     Coenzyme Q10 (COQ10 PO) Take by mouth.      loratadine (CLARITIN) 10 MG tablet Take 10 mg by mouth daily.     Omega-3 Fatty Acids (FISH OIL PO) Take by mouth.     Pseudoephedrine HCl (SUDAFED PO) Take by mouth.     No current facility-administered medications for this visit.    Family History  Problem Relation Age of Onset   Diabetes Mother    Heart disease Mother    Hypertension Mother    Heart disease Father        pace maker   Mental illness Brother    Breast cancer Neg Hx     Review of Systems  Constitutional: Negative.   HENT: Negative.   Eyes: Negative.   Respiratory: Negative.   Cardiovascular: Negative.   Gastrointestinal: Negative.   Endocrine: Negative.   Genitourinary: Negative.   Musculoskeletal: Negative.   Skin: Negative.   Allergic/Immunologic: Negative.   Neurological: Negative.   Hematological: Negative.   Psychiatric/Behavioral: Negative.     Exam:   BP 110/64    Pulse 68    Resp 16    Ht 5' 3.5" (1.613 m)    Wt 146 lb (66.2 kg)    LMP 01/22/2015 Comment: drop of blood 75mth ago   BMI 25.46 kg/m   Height: 5' 3.5" (161.3 cm)  General appearance:  alert, cooperative and appears stated age Head: Normocephalic, without obvious abnormality, atraumatic Neck: no adenopathy, supple, symmetrical, trachea midline and thyroid normal to inspection and palpation Lungs: clear to auscultation bilaterally Breasts: normal appearance, no masses or tenderness Heart: regular rate and rhythm Abdomen: soft, non-tender; bowel sounds normal; no masses,  no organomegaly Extremities: extremities normal, atraumatic, no cyanosis or edema Skin: Skin color, texture, turgor normal. No rashes or lesions Lymph nodes: Cervical, supraclavicular, and axillary nodes normal. No abnormal inguinal nodes palpated Neurologic: Grossly normal   Pelvic: External genitalia:  no lesions              Urethra:  normal appearing urethra with no masses, tenderness or lesions              Bartholins and Skenes: normal                  Vagina: normal appearing vagina with normal color and discharge, no lesions              Cervix: no lesions              Pap taken: No. Bimanual Exam:  Uterus:  normal size, contour, position, consistency, mobility, non-tender              Adnexa: normal adnexa and no mass, fullness, tenderness               Rectovaginal: Confirms               Anus:  normal sphincter tone, no lesions  Chaperone, Terence Lux, CMA, was present for exam.  A:  Well Woman with normal exam PMP, no HRT Insomnia (OTC remedies discussed as she has not tried this yet)  P:   Mammogram guidelines reviewed.   pap smear with neg HR HPV 2020 Colonoscopy referral placed today Lab work done 2020, not indicated Reviewed vaccines with pt.  She declines have updates. D/w pt consider BMD after age 22 Return annually or prn

## 2020-06-20 ENCOUNTER — Other Ambulatory Visit: Payer: Self-pay

## 2020-06-20 ENCOUNTER — Ambulatory Visit (INDEPENDENT_AMBULATORY_CARE_PROVIDER_SITE_OTHER): Payer: 59 | Admitting: Obstetrics & Gynecology

## 2020-06-20 ENCOUNTER — Encounter: Payer: Self-pay | Admitting: Obstetrics & Gynecology

## 2020-06-20 VITALS — BP 110/64 | HR 68 | Resp 16 | Ht 63.5 in | Wt 146.0 lb

## 2020-06-20 DIAGNOSIS — Z1211 Encounter for screening for malignant neoplasm of colon: Secondary | ICD-10-CM | POA: Diagnosis not present

## 2020-06-20 DIAGNOSIS — Z01419 Encounter for gynecological examination (general) (routine) without abnormal findings: Secondary | ICD-10-CM

## 2020-06-27 ENCOUNTER — Other Ambulatory Visit (HOSPITAL_COMMUNITY): Payer: Self-pay

## 2020-10-20 ENCOUNTER — Other Ambulatory Visit: Payer: Self-pay | Admitting: Obstetrics & Gynecology

## 2020-10-20 DIAGNOSIS — Z1231 Encounter for screening mammogram for malignant neoplasm of breast: Secondary | ICD-10-CM

## 2020-12-02 ENCOUNTER — Ambulatory Visit
Admission: RE | Admit: 2020-12-02 | Discharge: 2020-12-02 | Disposition: A | Discharge status: Home / Self Care | Payer: Managed Care, Other (non HMO) | Source: Ambulatory Visit | Attending: Obstetrics & Gynecology | Admitting: Obstetrics & Gynecology

## 2020-12-02 ENCOUNTER — Other Ambulatory Visit: Payer: Self-pay

## 2020-12-02 DIAGNOSIS — Z1231 Encounter for screening mammogram for malignant neoplasm of breast: Secondary | ICD-10-CM

## 2021-02-28 DIAGNOSIS — I8312 Varicose veins of left lower extremity with inflammation: Secondary | ICD-10-CM | POA: Diagnosis not present

## 2021-02-28 DIAGNOSIS — I8311 Varicose veins of right lower extremity with inflammation: Secondary | ICD-10-CM | POA: Diagnosis not present

## 2021-03-08 DIAGNOSIS — I8312 Varicose veins of left lower extremity with inflammation: Secondary | ICD-10-CM | POA: Diagnosis not present

## 2021-03-08 DIAGNOSIS — I8311 Varicose veins of right lower extremity with inflammation: Secondary | ICD-10-CM | POA: Diagnosis not present

## 2021-07-17 NOTE — Progress Notes (Signed)
60 y.o. [place] Married White or Caucasian Not Hispanic or Latino female here for annual exam.  No vaginal bleeding. Not sexually active.   She c/o intermittent abdominal bloated, seems diet related.  BM every 2-3 days (no change).  No urinary c/o.   No medical changes.     Patient's last menstrual period was 01/22/2015.          Sexually active: No.  The current method of family planning is post menopausal status.    Exercising: Yes.     Walking hiking  Smoker:  no  Health Maintenance: Pap:  04/20/19 WNL HR HPV Neg, 02-26-17 neg, 04-20-2019 neg HPV HR neg History of abnormal Pap:  Yes, h/o CIN 1 1995 MMG:  12/06/20 density C Bi-rads 1 neg  BMD:   none  Colonoscopy: none  TDaP:  2010 had reaction  Gardasil: na    reports that she has never smoked. She has never used smokeless tobacco. She reports that she does not drink alcohol and does not use drugs. Not working.   Past Medical History:  Diagnosis Date   Adenomyosis 1/11   Cervical polyp    Dysplasia of cervix, low grade (CIN 1) 1995    Past Surgical History:  Procedure Laterality Date   CERVICAL POLYPECTOMY N/A 05/09/2015   Procedure: CERVICAL POLYPECTOMY WITH ENDOMETRIAL CURRETTINGS AND VAGINAL BIOPSY;  Surgeon: Megan Salon, MD;  Location: [facility];  Service: Gynecology;  Laterality: N/A;   COLPOSCOPY     FACIAL RECONSTRUCTION SURGERY     HYSTEROSCOPY     HYSTEROSCOPY WITH D & C N/A 05/09/2015   Procedure: DILATATION AND CURETTAGE /HYSTEROSCOPY;  Surgeon: Megan Salon, MD;  Location: [facility];  Service: Gynecology;  Laterality: N/A;   TONSILLECTOMY      Current Outpatient Medications  Medication Sig Dispense Refill   Acetaminophen (TYLENOL PO) Take by mouth as needed.     B Complex Vitamins (B COMPLEX PO) Take by mouth.     Cholecalciferol (VITAMIN D PO) Take by mouth.     Coenzyme Q10 (COQ10 PO) Take by mouth.     fluticasone (FLONASE) 50 MCG/ACT nasal spray Place 1 spray into both nostrils daily.     loratadine  (CLARITIN) 10 MG tablet Take 10 mg by mouth daily.     Omega-3 Fatty Acids (FISH OIL PO) Take by mouth.     Pseudoephedrine HCl (SUDAFED PO) Take by mouth.     No current facility-administered medications for this visit.    Family History  Problem Relation Age of Onset   Diabetes Mother    Heart disease Mother    Hypertension Mother    Heart disease Father        pace maker   Mental illness Brother    Breast cancer Neg Hx     Review of Systems  All other systems reviewed and are negative.  Exam:   BP 110/64   Pulse 69   Ht 5\' 3"  (1.6 m)   Wt 150 lb (68 kg)   LMP 01/22/2015 Comment: drop of blood 43mth ago  SpO2 100%   BMI 26.57 kg/m   Weight change: @WEIGHTCHANGE @ Height:   Height: 5\' 3"  (160 cm)  Ht Readings from Last 3 Encounters:  07/18/21 5\' 3"  (1.6 m)  06/20/20 5' 3.5" (1.613 m)  04/05/20 5' 3.25" (1.607 m)    General appearance: alert, cooperative and appears stated age Head: Normocephalic, without obvious abnormality, atraumatic Neck: no adenopathy, supple, symmetrical, trachea midline and  thyroid normal to inspection and palpation Lungs: clear to auscultation bilaterally Cardiovascular: regular rate and rhythm Breasts: normal appearance, no masses or tenderness Abdomen: soft, non-tender; non distended,  no masses,  no organomegaly Extremities: extremities normal, atraumatic, no cyanosis or edema Skin: Skin color, texture, turgor normal. No rashes or lesions Lymph nodes: Cervical, supraclavicular, and axillary nodes normal. No abnormal inguinal nodes palpated Neurologic: Grossly normal   Pelvic: External genitalia:  no lesions              Urethra:  normal appearing urethra with no masses, tenderness or lesions              Bartholins and Skenes: normal                 Vagina: atrophic appearing vagina with normal color and discharge, no lesions              Cervix: no lesions               Bimanual Exam:  Uterus:  normal size, contour, position,  consistency, mobility, non-tender              Adnexa: no mass, fullness, tenderness               Rectovaginal: Confirms               Anus:  normal sphincter tone, no lesions   1. Well woman exam Discussed breast self exam Discussed calcium and vit D intake Mammogram UTD  2. Colon cancer screening Counseled as to options. Will refer her for colonoscopy - Ambulatory referral to Gastroenterology  3. Laboratory exam ordered as part of routine general medical examination - CBC - Comprehensive metabolic panel - Lipid panel  4. Vitamin D deficiency - VITAMIN D 25 Hydroxy (Vit-D Deficiency, Fractures)

## 2021-07-18 ENCOUNTER — Encounter: Payer: Self-pay | Admitting: Obstetrics and Gynecology

## 2021-07-18 ENCOUNTER — Other Ambulatory Visit: Payer: Self-pay

## 2021-07-18 ENCOUNTER — Ambulatory Visit (INDEPENDENT_AMBULATORY_CARE_PROVIDER_SITE_OTHER): Payer: BC Managed Care – PPO | Admitting: Obstetrics and Gynecology

## 2021-07-18 VITALS — BP 110/64 | HR 69 | Ht 63.0 in | Wt 150.0 lb

## 2021-07-18 DIAGNOSIS — Z01419 Encounter for gynecological examination (general) (routine) without abnormal findings: Secondary | ICD-10-CM | POA: Diagnosis not present

## 2021-07-18 DIAGNOSIS — Z0000 Encounter for general adult medical examination without abnormal findings: Secondary | ICD-10-CM

## 2021-07-18 DIAGNOSIS — Z1211 Encounter for screening for malignant neoplasm of colon: Secondary | ICD-10-CM

## 2021-07-18 DIAGNOSIS — E559 Vitamin D deficiency, unspecified: Secondary | ICD-10-CM | POA: Diagnosis not present

## 2021-07-18 NOTE — Patient Instructions (Signed)

## 2021-07-19 LAB — LIPID PANEL
Cholesterol: 249 mg/dL — ABNORMAL HIGH (ref <200)
HDL: 55 mg/dL (ref >=50)
LDL Cholesterol (Calc): 161 mg/dL (calc) — ABNORMAL HIGH
Non-HDL Cholesterol (Calc): 194 mg/dL (calc) — ABNORMAL HIGH (ref <130)
Total CHOL/HDL Ratio: 4.5 (calc) (ref <5.0)
Triglycerides: 179 mg/dL — ABNORMAL HIGH (ref <150)

## 2021-07-19 LAB — COMPREHENSIVE METABOLIC PANEL
AG Ratio: 1.5 (calc) (ref 1.0–2.5)
ALT: 12 U/L (ref 6–29)
AST: 16 U/L (ref 10–35)
Albumin: 4.4 g/dL (ref 3.6–5.1)
Alkaline phosphatase (APISO): 52 U/L (ref 37–153)
BUN: 16 mg/dL (ref 7–25)
CO2: 30 mmol/L (ref 20–32)
Calcium: 9.5 mg/dL (ref 8.6–10.4)
Chloride: 104 mmol/L (ref 98–110)
Creat: 0.76 mg/dL (ref 0.50–1.05)
Globulin: 2.9 g/dL (calc) (ref 1.9–3.7)
Glucose, Bld: 92 mg/dL (ref 65–99)
Potassium: 4.5 mmol/L (ref 3.5–5.3)
Sodium: 141 mmol/L (ref 135–146)
Total Bilirubin: 0.5 mg/dL (ref 0.2–1.2)
Total Protein: 7.3 g/dL (ref 6.1–8.1)

## 2021-07-19 LAB — CBC
HCT: 40.2 % (ref 35.0–45.0)
Hemoglobin: 13.6 g/dL (ref 11.7–15.5)
MCH: 32.4 pg (ref 27.0–33.0)
MCHC: 33.8 g/dL (ref 32.0–36.0)
MCV: 95.7 fL (ref 80.0–100.0)
MPV: 9.5 fL (ref 7.5–12.5)
Platelets: 245 10*3/uL (ref 140–400)
RBC: 4.2 10*6/uL (ref 3.80–5.10)
RDW: 12.6 % (ref 11.0–15.0)
WBC: 5.1 10*3/uL (ref 3.8–10.8)

## 2021-07-19 LAB — VITAMIN D 25 HYDROXY (VIT D DEFICIENCY, FRACTURES): Vit D, 25-Hydroxy: 38 ng/mL (ref 30–100)

## 2021-08-08 ENCOUNTER — Ambulatory Visit: Payer: 59 | Admitting: Obstetrics and Gynecology

## 2021-09-06 ENCOUNTER — Encounter: Payer: Self-pay | Admitting: Obstetrics and Gynecology

## 2021-11-23 ENCOUNTER — Other Ambulatory Visit: Payer: Self-pay | Admitting: Obstetrics and Gynecology

## 2021-11-23 DIAGNOSIS — Z1231 Encounter for screening mammogram for malignant neoplasm of breast: Secondary | ICD-10-CM

## 2021-12-06 ENCOUNTER — Other Ambulatory Visit: Payer: Self-pay | Admitting: Obstetrics and Gynecology

## 2021-12-06 ENCOUNTER — Ambulatory Visit
Admission: RE | Admit: 2021-12-06 | Discharge: 2021-12-06 | Disposition: A | Discharge status: Home / Self Care | Payer: BC Managed Care – PPO | Source: Ambulatory Visit | Attending: Obstetrics and Gynecology | Admitting: Obstetrics and Gynecology

## 2021-12-06 DIAGNOSIS — Z1231 Encounter for screening mammogram for malignant neoplasm of breast: Secondary | ICD-10-CM | POA: Diagnosis not present

## 2022-04-12 DIAGNOSIS — U071 COVID-19: Secondary | ICD-10-CM | POA: Diagnosis not present

## 2022-04-12 DIAGNOSIS — Z20822 Contact with and (suspected) exposure to covid-19: Secondary | ICD-10-CM | POA: Diagnosis not present

## 2022-04-12 DIAGNOSIS — R310 Gross hematuria: Secondary | ICD-10-CM | POA: Diagnosis not present

## 2022-04-12 DIAGNOSIS — R8290 Unspecified abnormal findings in urine: Secondary | ICD-10-CM | POA: Diagnosis not present

## 2022-04-13 DIAGNOSIS — R310 Gross hematuria: Secondary | ICD-10-CM | POA: Diagnosis not present

## 2022-04-13 DIAGNOSIS — R8290 Unspecified abnormal findings in urine: Secondary | ICD-10-CM | POA: Diagnosis not present

## 2022-07-14 DIAGNOSIS — J029 Acute pharyngitis, unspecified: Secondary | ICD-10-CM | POA: Diagnosis not present

## 2022-07-14 DIAGNOSIS — J020 Streptococcal pharyngitis: Secondary | ICD-10-CM | POA: Diagnosis not present

## 2022-07-14 DIAGNOSIS — H6093 Unspecified otitis externa, bilateral: Secondary | ICD-10-CM | POA: Diagnosis not present

## 2022-07-25 NOTE — Progress Notes (Signed)
61 y.o. G0P0000 Married White or Caucasian Not Hispanic or Latino female here for annual exam.  No vaginal bleeding. Not sexually active.   No bowel or bladder c/o.     Patient's last menstrual period was 01/22/2015.          Sexually active: No.  The current method of family planning is post menopausal status.    Exercising: No.  The patient does not participate in regular exercise at present. Smoker:  no  Health Maintenance: Pap: 04/20/19 WNL HR HPV Neg, 02-26-17 neg, 04-20-2019 neg HPV HR neg History of abnormal Pap:  Yes, h/o CIN 1 1995 MMG:  12/07/21 density C Bi-rads 1 neg  BMD:   none  Colonoscopy: none  TDaP:  10/22/08, had a reaction to the Tdap.  Gardasil: n/a   reports that she has never smoked. She has never used smokeless tobacco. She reports that she does not drink alcohol and does not use drugs. Not working.   Past Medical History:  Diagnosis Date   Adenomyosis 1/11   Cervical polyp    Dysplasia of cervix, low grade (CIN 1) 1995    Past Surgical History:  Procedure Laterality Date   CERVICAL POLYPECTOMY N/A 05/09/2015   Procedure: CERVICAL POLYPECTOMY WITH ENDOMETRIAL CURRETTINGS AND VAGINAL BIOPSY;  Surgeon: Megan Salon, MD;  Location: [facility];  Service: Gynecology;  Laterality: N/A;   COLPOSCOPY     FACIAL RECONSTRUCTION SURGERY     HYSTEROSCOPY     HYSTEROSCOPY WITH D & C N/A 05/09/2015   Procedure: DILATATION AND CURETTAGE /HYSTEROSCOPY;  Surgeon: Megan Salon, MD;  Location: [facility];  Service: Gynecology;  Laterality: N/A;   TONSILLECTOMY      Current Outpatient Medications  Medication Sig Dispense Refill   Acetaminophen (TYLENOL PO) Take by mouth as needed.     B Complex Vitamins (B COMPLEX PO) Take by mouth.     Cholecalciferol (VITAMIN D PO) Take by mouth.     Coenzyme Q10 (COQ10 PO) Take by mouth.     fluticasone (FLONASE) 50 MCG/ACT nasal spray Place 1 spray into both nostrils daily.     loratadine (CLARITIN) 10 MG tablet Take 10 mg by mouth daily.      Omega-3 Fatty Acids (FISH OIL PO) Take by mouth.     Pseudoephedrine HCl (SUDAFED PO) Take by mouth.     No current facility-administered medications for this visit.    Family History  Problem Relation Age of Onset   Diabetes Mother    Heart disease Mother    Hypertension Mother    Heart disease Father        pace maker   Mental illness Brother    Breast cancer Neg Hx     Review of Systems  Exam:   BP 130/72   Pulse 66   Ht '5\' 3"'$  (1.6 m)   Wt 151 lb (68.5 kg)   LMP 01/22/2015 Comment: drop of blood 84mh ago  SpO2 100%   BMI 26.75 kg/m   Weight change: '@WEIGHTCHANGE'$ @ Height:   Height: '5\' 3"'$  (160 cm)  Ht Readings from Last 3 Encounters:  08/01/22 '5\' 3"'$  (1.6 m)  07/18/21 '5\' 3"'$  (1.6 m)  06/20/20 5' 3.5" (1.613 m)    General appearance: alert, cooperative and appears stated age Head: Normocephalic, without obvious abnormality, atraumatic Neck: no adenopathy, supple, symmetrical, trachea midline and thyroid normal to inspection and palpation Lungs: clear to auscultation bilaterally Cardiovascular: regular rate and rhythm Breasts: normal appearance, no masses  or tenderness Abdomen: soft, non-tender; non distended,  no masses,  no organomegaly Extremities: extremities normal, atraumatic, no cyanosis or edema Skin: Skin color, texture, turgor normal. No rashes or lesions Lymph nodes: Cervical, supraclavicular, and axillary nodes normal. No abnormal inguinal nodes palpated Neurologic: Grossly normal   Pelvic: External genitalia:  no lesions              Urethra:  normal appearing urethra with no masses, tenderness or lesions              Bartholins and Skenes: normal                 Vagina: atrophic appearing vagina with normal color and discharge, no lesions              Cervix: no lesions               Bimanual Exam:  Uterus:   no masses or tenderness              Adnexa: no mass, fullness, tenderness               Rectovaginal: Confirms               Anus:  normal  sphincter tone, no lesions  Gae Dry, CMA chaperoned for the exam.  1. Well woman exam Discussed breast self exam Discussed calcium and vit D intake Mammogram utd  2. Colon cancer screening - Ambulatory referral to Gastroenterology  3. Lipids abnormal Not fasting - Lipid panel

## 2022-08-01 ENCOUNTER — Encounter: Payer: Self-pay | Admitting: Obstetrics and Gynecology

## 2022-08-01 ENCOUNTER — Ambulatory Visit (INDEPENDENT_AMBULATORY_CARE_PROVIDER_SITE_OTHER): Payer: BC Managed Care – PPO | Admitting: Obstetrics and Gynecology

## 2022-08-01 VITALS — BP 130/72 | HR 66 | Ht 63.0 in | Wt 151.0 lb

## 2022-08-01 DIAGNOSIS — E7889 Other lipoprotein metabolism disorders: Secondary | ICD-10-CM

## 2022-08-01 DIAGNOSIS — Z01419 Encounter for gynecological examination (general) (routine) without abnormal findings: Secondary | ICD-10-CM | POA: Diagnosis not present

## 2022-08-01 DIAGNOSIS — Z1211 Encounter for screening for malignant neoplasm of colon: Secondary | ICD-10-CM

## 2022-08-01 NOTE — Patient Instructions (Signed)

## 2022-08-02 LAB — LIPID PANEL
Cholesterol: 224 mg/dL — ABNORMAL HIGH (ref <200)
HDL: 49 mg/dL — ABNORMAL LOW (ref >=50)
LDL Cholesterol (Calc): 145 mg/dL (calc) — ABNORMAL HIGH
Non-HDL Cholesterol (Calc): 175 mg/dL (calc) — ABNORMAL HIGH (ref <130)
Total CHOL/HDL Ratio: 4.6 (calc) (ref <5.0)
Triglycerides: 160 mg/dL — ABNORMAL HIGH (ref <150)

## 2022-12-07 ENCOUNTER — Other Ambulatory Visit: Payer: Self-pay | Admitting: Obstetrics and Gynecology

## 2022-12-07 DIAGNOSIS — Z1231 Encounter for screening mammogram for malignant neoplasm of breast: Secondary | ICD-10-CM

## 2023-01-24 ENCOUNTER — Ambulatory Visit
Admission: RE | Admit: 2023-01-24 | Discharge: 2023-01-24 | Disposition: A | Discharge status: Home / Self Care | Payer: BC Managed Care – PPO | Source: Ambulatory Visit | Attending: Obstetrics and Gynecology | Admitting: Obstetrics and Gynecology

## 2023-01-24 DIAGNOSIS — Z1231 Encounter for screening mammogram for malignant neoplasm of breast: Secondary | ICD-10-CM

## 2023-03-19 DIAGNOSIS — L300 Nummular dermatitis: Secondary | ICD-10-CM | POA: Diagnosis not present

## 2023-09-12 ENCOUNTER — Other Ambulatory Visit (HOSPITAL_COMMUNITY)
Admission: RE | Admit: 2023-09-12 | Discharge: 2023-09-12 | Disposition: A | Discharge status: Home / Self Care | Payer: BC Managed Care – PPO | Source: Ambulatory Visit | Attending: Obstetrics and Gynecology | Admitting: Obstetrics and Gynecology

## 2023-09-12 ENCOUNTER — Encounter: Payer: Self-pay | Admitting: Obstetrics and Gynecology

## 2023-09-12 ENCOUNTER — Ambulatory Visit: Payer: BC Managed Care – PPO | Admitting: Obstetrics and Gynecology

## 2023-09-12 ENCOUNTER — Ambulatory Visit (INDEPENDENT_AMBULATORY_CARE_PROVIDER_SITE_OTHER): Payer: BC Managed Care – PPO | Admitting: Obstetrics and Gynecology

## 2023-09-12 VITALS — BP 120/80 | HR 70 | Resp 16 | Ht 63.0 in | Wt 156.0 lb

## 2023-09-12 DIAGNOSIS — E2839 Other primary ovarian failure: Secondary | ICD-10-CM

## 2023-09-12 DIAGNOSIS — Z01419 Encounter for gynecological examination (general) (routine) without abnormal findings: Secondary | ICD-10-CM

## 2023-09-12 DIAGNOSIS — Z1211 Encounter for screening for malignant neoplasm of colon: Secondary | ICD-10-CM

## 2023-09-12 DIAGNOSIS — Z1231 Encounter for screening mammogram for malignant neoplasm of breast: Secondary | ICD-10-CM

## 2023-09-12 NOTE — Progress Notes (Signed)
62 y.o. y.o. female here for annual exam. Patient's last menstrual period was 01/22/2015.     Patient's last menstrual period was 01/22/2015.          Sexually active: No.  The current method of family planning is post menopausal status.    Exercising: No.  The patient does not participate in regular exercise at present. Smoker:  no  Health Maintenance: Pap: 04/20/19 WNL HR HPV Neg, 02-26-17 neg, 04-20-2019 neg HPV HR neg History of abnormal Pap:  Yes, h/o CIN 1 1995 MMG:  01/24/23, birads 1 neg BMD:   none  Colonoscopy: none  TDaP:  10/22/08, had a reaction to the Tdap.  Gardasil: n/a Body mass index is 27.63 kg/m.     09/12/2023    2:03 PM  Depression screen PHQ 2/9  Decreased Interest 0  Down, Depressed, Hopeless 0  PHQ - 2 Score 0    Blood pressure 120/80, pulse 70, resp. rate 16, height 5\' 3"  (1.6 m), weight 156 lb (70.8 kg), last menstrual period 01/22/2015.     Component Value Date/Time   DIAGPAP  04/20/2019 0000    NEGATIVE FOR INTRAEPITHELIAL LESIONS OR MALIGNANCY.   DIAGPAP  02/26/2017 0000    NEGATIVE FOR INTRAEPITHELIAL LESIONS OR MALIGNANCY.   ADEQPAP  04/20/2019 0000    Satisfactory for evaluation  endocervical/transformation zone component PRESENT.   ADEQPAP  02/26/2017 0000    Satisfactory for evaluation  endocervical/transformation zone component PRESENT.    GYN HISTORY:    Component Value Date/Time   DIAGPAP  04/20/2019 0000    NEGATIVE FOR INTRAEPITHELIAL LESIONS OR MALIGNANCY.   DIAGPAP  02/26/2017 0000    NEGATIVE FOR INTRAEPITHELIAL LESIONS OR MALIGNANCY.   ADEQPAP  04/20/2019 0000    Satisfactory for evaluation  endocervical/transformation zone component PRESENT.   ADEQPAP  02/26/2017 0000    Satisfactory for evaluation  endocervical/transformation zone component PRESENT.    OB History  Gravida Para Term Preterm AB Living  0 0 0 0 0 0  SAB IAB Ectopic Multiple Live Births  0 0 0 0      Past Medical History:  Diagnosis Date    Adenomyosis 1/11   Cervical polyp    Dysplasia of cervix, low grade (CIN 1) 1995    Past Surgical History:  Procedure Laterality Date   CERVICAL POLYPECTOMY N/A 05/09/2015   Procedure: CERVICAL POLYPECTOMY WITH ENDOMETRIAL CURRETTINGS AND VAGINAL BIOPSY;  Surgeon: Jerene Bears, MD;  Location: [facility];  Service: Gynecology;  Laterality: N/A;   COLPOSCOPY     FACIAL RECONSTRUCTION SURGERY     HYSTEROSCOPY     HYSTEROSCOPY WITH D & C N/A 05/09/2015   Procedure: DILATATION AND CURETTAGE /HYSTEROSCOPY;  Surgeon: Jerene Bears, MD;  Location: [facility];  Service: Gynecology;  Laterality: N/A;   ROOT CANAL     TONSILLECTOMY      Current Outpatient Medications on File Prior to Visit  Medication Sig Dispense Refill   Acetaminophen (TYLENOL PO) Take by mouth as needed.     loratadine (CLARITIN) 10 MG tablet Take 10 mg by mouth daily.     Pseudoephedrine HCl (SUDAFED PO) Take by mouth.     No current facility-administered medications on file prior to visit.    Social History   Socioeconomic History   Marital status: Married    Spouse name: Not on file   Number of children: Not on file   Years of education: Not on file   Highest education level:  Not on file  Occupational History   Not on file  Tobacco Use   Smoking status: Never   Smokeless tobacco: Never  Substance and Sexual Activity   Alcohol use: No    Alcohol/week: 0.0 standard drinks of alcohol   Drug use: No   Sexual activity: Not Currently    Partners: Male    Birth control/protection: Post-menopausal  Other Topics Concern   Not on file  Social History Narrative   Not on file   Social Determinants of Health   Financial Resource Strain: Not on file  Food Insecurity: Not on file  Transportation Needs: Not on file  Physical Activity: Not on file  Stress: Not on file  Social Connections: Not on file  Intimate Partner Violence: Not on file    Family History  Problem Relation Age of Onset   Diabetes Mother     Heart disease Mother    Hypertension Mother    Heart disease Father        pace maker   Mental illness Brother    Breast cancer Neg Hx      Allergies  Allergen Reactions   Macrobid [Nitrofurantoin Monohyd Macro]     Big itchy red bumps   Sulfa Antibiotics Other (See Comments)    headache   Latex Rash   Tdap [Tetanus-Diphth-Acell Pertussis] Rash    Fever, chills, muscle aches, HA      Patient's last menstrual period was Patient's last menstrual period was 01/22/2015.Marland Kitchen            Review of Systems Alls systems reviewed and are negative.     OBGyn Exam    A:         Well Woman GYN exam                             P:        Pap smear collected today Encouraged annual mammogram screening Colon cancer screening referral placed today.  Counseled on importance DXA ordered today Labs and immunizations ordered today Discussed breast self exams Encouraged healthy lifestyle practices Encouraged Vit D and Calcium -not currently taking due to REFLUX. To try tums  No follow-ups on file.  Earley Favor

## 2023-09-13 LAB — CBC
HCT: 42 % (ref 35.0–45.0)
Hemoglobin: 13.6 g/dL (ref 11.7–15.5)
MCH: 31.5 pg (ref 27.0–33.0)
MCHC: 32.4 g/dL (ref 32.0–36.0)
MCV: 97.2 fL (ref 80.0–100.0)
MPV: 10 fL (ref 7.5–12.5)
Platelets: 263 10*3/uL (ref 140–400)
RBC: 4.32 10*6/uL (ref 3.80–5.10)
RDW: 12.3 % (ref 11.0–15.0)
WBC: 6 10*3/uL (ref 3.8–10.8)

## 2023-09-13 LAB — COMPREHENSIVE METABOLIC PANEL
AG Ratio: 1.4 (calc) (ref 1.0–2.5)
ALT: 13 U/L (ref 6–29)
AST: 16 U/L (ref 10–35)
Albumin: 4.4 g/dL (ref 3.6–5.1)
Alkaline phosphatase (APISO): 56 U/L (ref 37–153)
BUN: 15 mg/dL (ref 7–25)
CO2: 31 mmol/L (ref 20–32)
Calcium: 9.7 mg/dL (ref 8.6–10.4)
Chloride: 102 mmol/L (ref 98–110)
Creat: 0.82 mg/dL (ref 0.50–1.05)
Globulin: 3.1 g/dL (ref 1.9–3.7)
Glucose, Bld: 96 mg/dL (ref 65–99)
Potassium: 4.5 mmol/L (ref 3.5–5.3)
Sodium: 141 mmol/L (ref 135–146)
Total Bilirubin: 0.6 mg/dL (ref 0.2–1.2)
Total Protein: 7.5 g/dL (ref 6.1–8.1)

## 2023-09-13 LAB — HEMOGLOBIN A1C
Hgb A1c MFr Bld: 6 %{Hb} — ABNORMAL HIGH (ref <5.7)
Mean Plasma Glucose: 126 mg/dL
eAG (mmol/L): 7 mmol/L

## 2023-09-13 LAB — LIPID PANEL
Cholesterol: 240 mg/dL — ABNORMAL HIGH (ref <200)
HDL: 53 mg/dL (ref >=50)
LDL Cholesterol (Calc): 156 mg/dL — ABNORMAL HIGH
Non-HDL Cholesterol (Calc): 187 mg/dL — ABNORMAL HIGH (ref <130)
Total CHOL/HDL Ratio: 4.5 (calc) (ref <5.0)
Triglycerides: 179 mg/dL — ABNORMAL HIGH (ref <150)

## 2023-09-13 LAB — TSH: TSH: 1.6 m[IU]/L (ref 0.40–4.50)

## 2023-09-13 LAB — VITAMIN D 25 HYDROXY (VIT D DEFICIENCY, FRACTURES): Vit D, 25-Hydroxy: 32 ng/mL (ref 30–100)

## 2023-09-16 LAB — CYTOLOGY - PAP
Comment: NEGATIVE
Diagnosis: NEGATIVE
High risk HPV: NEGATIVE

## 2023-12-07 IMAGING — MG DIGITAL SCREENING BILAT W/ CAD
4 series · 4 of 4 positions shown · non-contrast
Comparison: Previous exam(s).

CLINICAL DATA: Screening.

EXAM:
DIGITAL SCREENING BILATERAL MAMMOGRAM WITH CAD
TECHNIQUE: Bilateral screening digital craniocaudal and mediolateral oblique
mammograms were obtained. The images were evaluated with
computer-aided detection.

[L CC]
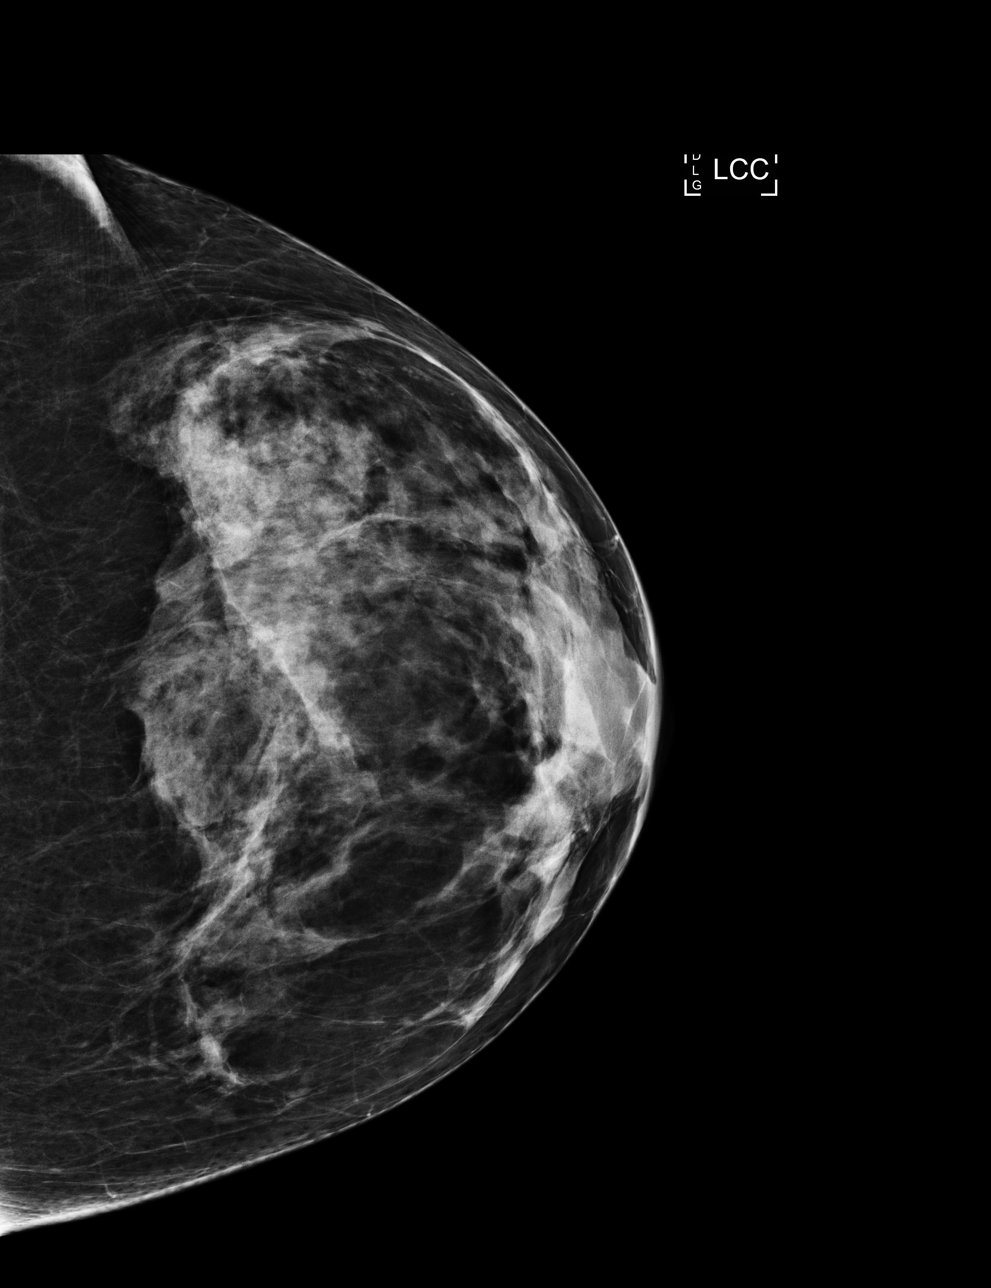

[R MLO]
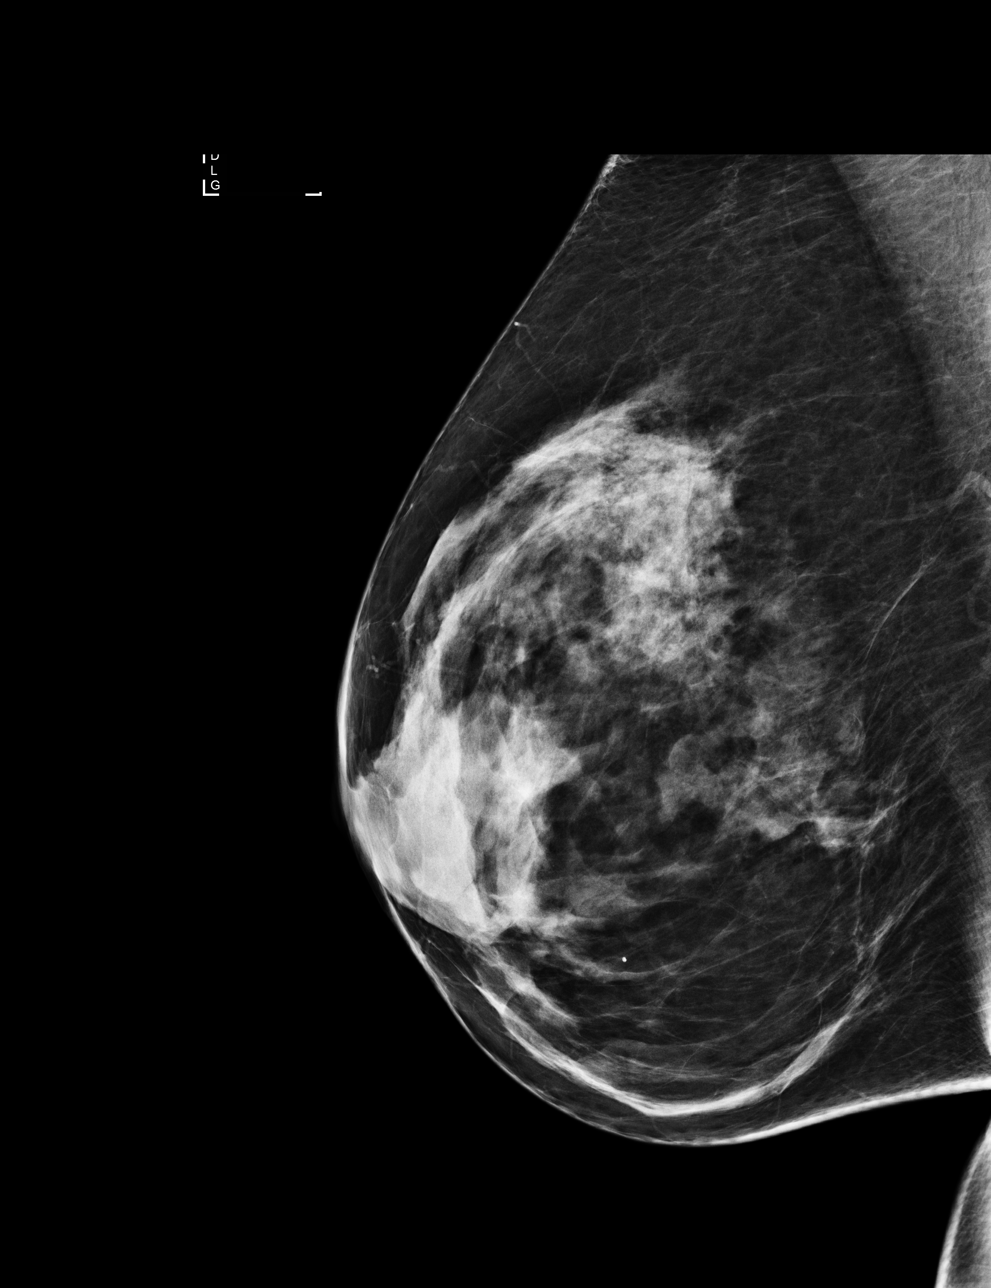

[L MLO]
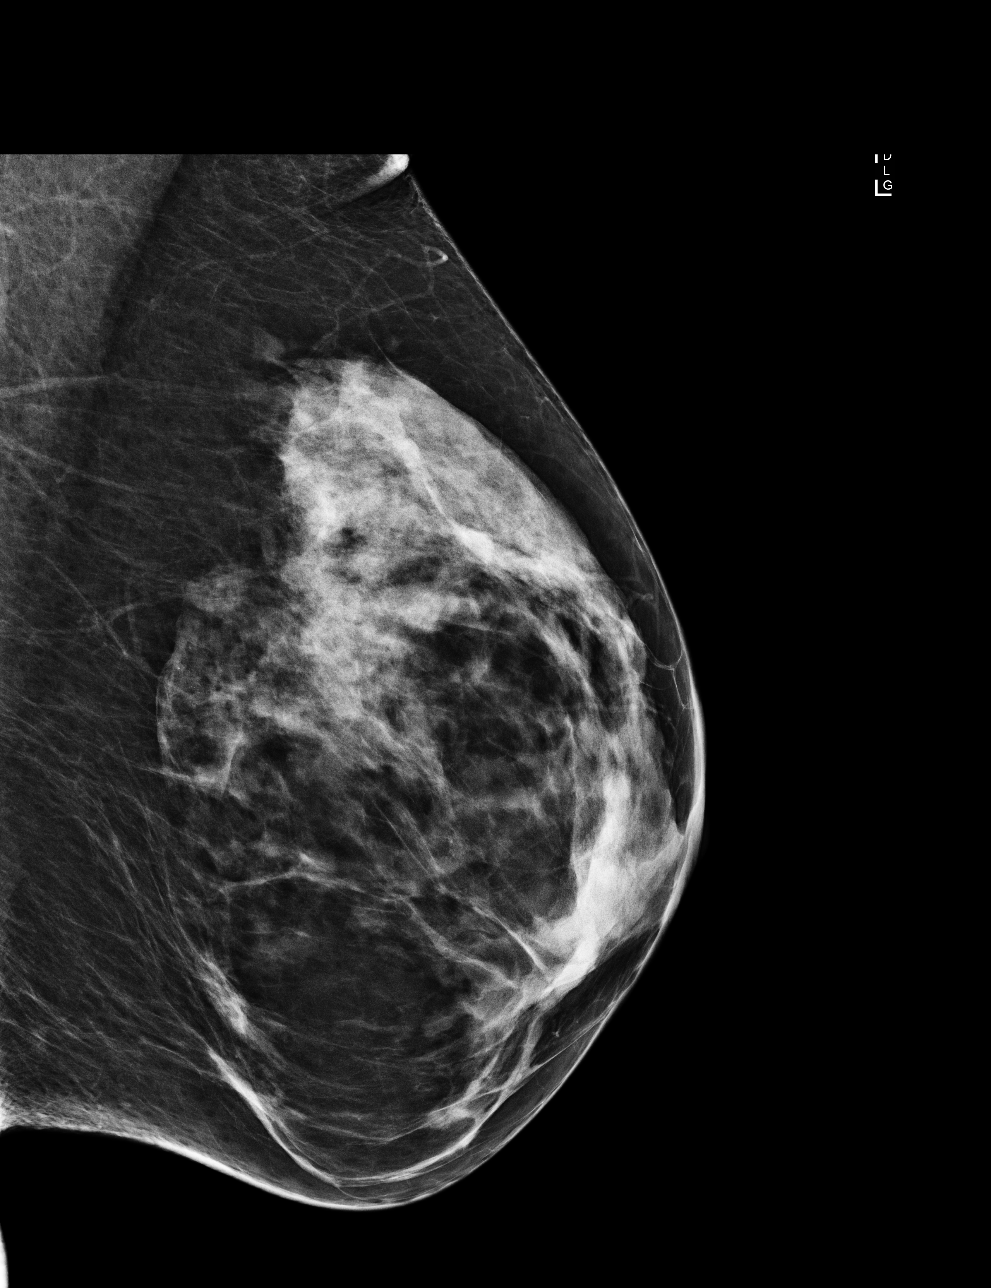

[R CC]
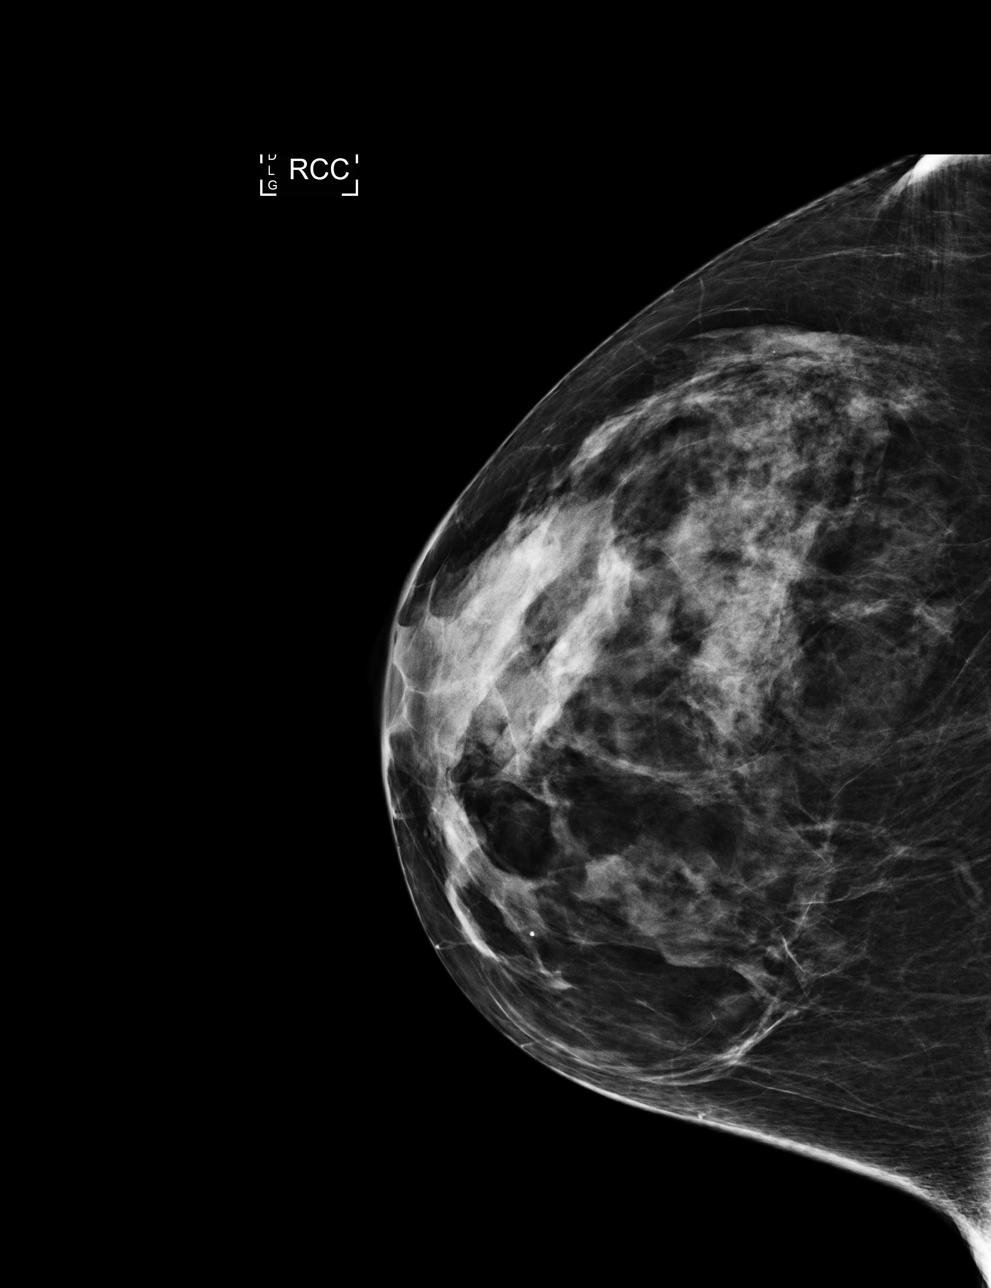

[4 of 4 positions shown; findings below may reference images not displayed]

ACR Breast Density Category c: The breast tissue is heterogeneously
dense, which may obscure small masses.
FINDINGS: There are no findings suspicious for malignancy.
IMPRESSION: No mammographic evidence of malignancy. A result letter of this
screening mammogram will be mailed directly to the patient.

RECOMMENDATION:
Screening mammogram in one year. (Code:TJ-B-ZPA)

BI-RADS CATEGORY  1: Negative.

## 2023-12-11 ENCOUNTER — Encounter: Payer: Self-pay | Admitting: Obstetrics and Gynecology

## 2024-02-20 ENCOUNTER — Other Ambulatory Visit: Payer: Self-pay | Admitting: Obstetrics and Gynecology

## 2024-02-20 DIAGNOSIS — Z1231 Encounter for screening mammogram for malignant neoplasm of breast: Secondary | ICD-10-CM

## 2024-03-17 ENCOUNTER — Ambulatory Visit
Admission: RE | Admit: 2024-03-17 | Discharge: 2024-03-17 | Disposition: A | Discharge status: Home / Self Care | Payer: PRIVATE HEALTH INSURANCE | Source: Ambulatory Visit | Attending: Obstetrics and Gynecology | Admitting: Obstetrics and Gynecology

## 2024-03-17 DIAGNOSIS — Z1231 Encounter for screening mammogram for malignant neoplasm of breast: Secondary | ICD-10-CM

## 2024-03-19 ENCOUNTER — Ambulatory Visit: Payer: Self-pay | Admitting: Obstetrics and Gynecology

## 2024-03-23 ENCOUNTER — Telehealth: Payer: Self-pay | Admitting: Obstetrics and Gynecology

## 2024-03-23 DIAGNOSIS — E2839 Other primary ovarian failure: Secondary | ICD-10-CM

## 2024-03-23 NOTE — Telephone Encounter (Signed)
 Bone scan referral

## 2024-09-14 ENCOUNTER — Ambulatory Visit: Admitting: Obstetrics and Gynecology

## 2024-10-14 ENCOUNTER — Ambulatory Visit: Payer: Self-pay | Admitting: Obstetrics and Gynecology

## 2024-10-14 ENCOUNTER — Other Ambulatory Visit (HOSPITAL_COMMUNITY)
Admission: RE | Admit: 2024-10-14 | Discharge: 2024-10-14 | Disposition: A | Discharge status: Home / Self Care | Payer: PRIVATE HEALTH INSURANCE | Source: Ambulatory Visit | Attending: Obstetrics and Gynecology | Admitting: Obstetrics and Gynecology

## 2024-10-14 ENCOUNTER — Encounter: Payer: Self-pay | Admitting: Obstetrics and Gynecology

## 2024-10-14 VITALS — BP 120/80 | HR 64 | Ht 63.0 in | Wt 147.0 lb

## 2024-10-14 DIAGNOSIS — Z9189 Other specified personal risk factors, not elsewhere classified: Secondary | ICD-10-CM | POA: Diagnosis not present

## 2024-10-14 DIAGNOSIS — D229 Melanocytic nevi, unspecified: Secondary | ICD-10-CM

## 2024-10-14 DIAGNOSIS — Z1211 Encounter for screening for malignant neoplasm of colon: Secondary | ICD-10-CM

## 2024-10-14 DIAGNOSIS — E2839 Other primary ovarian failure: Secondary | ICD-10-CM

## 2024-10-14 DIAGNOSIS — Z01419 Encounter for gynecological examination (general) (routine) without abnormal findings: Secondary | ICD-10-CM | POA: Diagnosis present

## 2024-10-14 DIAGNOSIS — N952 Postmenopausal atrophic vaginitis: Secondary | ICD-10-CM | POA: Diagnosis not present

## 2024-10-14 MED ORDER — PROGESTERONE MICRONIZED 100 MG PO CAPS: 100.0000 mg | ORAL_CAPSULE | Freq: Every day | ORAL | 3 refills | Status: AC

## 2024-10-14 NOTE — Progress Notes (Signed)
 Pap:  09/12/23 Mammo: 03/17/24 Colonoscopy: not yet Dexa: not yet    Questions regarding sleep, Dexa, colonoscopy

## 2024-10-14 NOTE — Progress Notes (Signed)
 "   64 y.o. y.o. female here for annual exam. Patient's last menstrual period was 01/22/2015.   Sexually active: No. Married. Husband is a truck driver The current method of family planning is post menopausal status.    Exercising: No.  The patient does not participate in regular exercise at present. Smoker:  no  Health Maintenance: Pap: 04/20/19 WNL HR HPV Neg, 02-26-17 neg, 04-20-2019 neg HPV HR neg History of abnormal Pap:  Yes, h/o CIN 1 1995 MMG:  03/17/24 BMD:   none  Colonoscopy: none patient to try to do cologuard TDaP:  10/22/08, had a reaction to the Tdap.  Gardasil: n/a Referral placed to dermatology for mole check, primary care to establish care. Labs here today Brother with adrenal insufficiency and she would like testing. Referral placed to endocrinology Reports insomnia, hot flashes Unsure if snores. To try prometrium  at bedtime NO PMB Referral placed for cardiac ct score test. Discussed self pay. Body mass index is 26.04 kg/m.    Blood pressure 120/80, pulse 64, height 5' 3 (1.6 m), weight 147 lb (66.7 kg), last menstrual period 01/22/2015, SpO2 99%.     Component Value Date/Time   DIAGPAP  09/12/2023 1438    - Negative for intraepithelial lesion or malignancy (NILM)   DIAGPAP  04/20/2019 0000    NEGATIVE FOR INTRAEPITHELIAL LESIONS OR MALIGNANCY.   DIAGPAP  02/26/2017 0000    NEGATIVE FOR INTRAEPITHELIAL LESIONS OR MALIGNANCY.   HPVHIGH Negative 09/12/2023 1438   ADEQPAP  09/12/2023 1438    Satisfactory for evaluation; transformation zone component PRESENT.   ADEQPAP  04/20/2019 0000    Satisfactory for evaluation  endocervical/transformation zone component PRESENT.   ADEQPAP  02/26/2017 0000    Satisfactory for evaluation  endocervical/transformation zone component PRESENT.    GYN HISTORY:    Component Value Date/Time   DIAGPAP  09/12/2023 1438    - Negative for intraepithelial lesion or malignancy (NILM)   DIAGPAP  04/20/2019 0000    NEGATIVE FOR  INTRAEPITHELIAL LESIONS OR MALIGNANCY.   DIAGPAP  02/26/2017 0000    NEGATIVE FOR INTRAEPITHELIAL LESIONS OR MALIGNANCY.   HPVHIGH Negative 09/12/2023 1438   ADEQPAP  09/12/2023 1438    Satisfactory for evaluation; transformation zone component PRESENT.   ADEQPAP  04/20/2019 0000    Satisfactory for evaluation  endocervical/transformation zone component PRESENT.   ADEQPAP  02/26/2017 0000    Satisfactory for evaluation  endocervical/transformation zone component PRESENT.    OB History  Gravida Para Term Preterm AB Living  0 0 0 0 0 0  SAB IAB Ectopic Multiple Live Births  0 0 0 0     Past Medical History:  Diagnosis Date   Adenomyosis 1/11   Cervical polyp    Dysplasia of cervix, low grade (CIN 1) 1995    Past Surgical History:  Procedure Laterality Date   CERVICAL POLYPECTOMY N/A 05/09/2015   Procedure: CERVICAL POLYPECTOMY WITH ENDOMETRIAL CURRETTINGS AND VAGINAL BIOPSY;  Surgeon: Ronal GORMAN Pinal, MD;  Location: [facility];  Service: Gynecology;  Laterality: N/A;   COLPOSCOPY     FACIAL RECONSTRUCTION SURGERY     HYSTEROSCOPY     HYSTEROSCOPY WITH D & C N/A 05/09/2015   Procedure: DILATATION AND CURETTAGE /HYSTEROSCOPY;  Surgeon: Ronal GORMAN Pinal, MD;  Location: [facility];  Service: Gynecology;  Laterality: N/A;   ROOT CANAL     TONSILLECTOMY      Medications Ordered Prior to Encounter[1]  Social History   Socioeconomic History   Marital status:  Married    Spouse name: Not on file   Number of children: Not on file   Years of education: Not on file   Highest education level: Not on file  Occupational History   Not on file  Tobacco Use   Smoking status: Never   Smokeless tobacco: Never  Substance and Sexual Activity   Alcohol use: No    Alcohol/week: 0.0 standard drinks of alcohol   Drug use: No   Sexual activity: Not Currently    Partners: Male    Birth control/protection: Post-menopausal  Other Topics Concern   Not on file  Social History Narrative   Not on file    Social Drivers of Health   Tobacco Use: Low Risk (10/14/2024)   Patient History    Smoking Tobacco Use: Never    Smokeless Tobacco Use: Never    Passive Exposure: Not on file  Financial Resource Strain: Not on file  Food Insecurity: Not on file  Transportation Needs: Not on file  Physical Activity: Not on file  Stress: Not on file  Social Connections: Not on file  Intimate Partner Violence: Not on file  Depression (PHQ2-9): Low Risk (10/14/2024)   Depression (PHQ2-9)    PHQ-2 Score: 0  Alcohol Screen: Not on file  Housing: Not on file  Utilities: Not on file  Health Literacy: Not on file    Family History  Problem Relation Age of Onset   Diabetes Mother    Heart disease Mother    Hypertension Mother    Heart disease Father        pace maker   Mental illness Brother    Breast cancer Neg Hx      Allergies[2]    Patient's last menstrual period was Patient's last menstrual period was 01/22/2015.Karina Gaines            Review of Systems Alls systems reviewed and are negative.     Physical Exam Constitutional:      Appearance: Normal appearance.  Genitourinary:     Vulva and urethral meatus normal.     No lesions in the vagina.     Right Labia: No rash, lesions or skin changes.    Left Labia: No lesions, skin changes or rash.    No vaginal discharge or tenderness.     No vaginal prolapse present.    Moderate vaginal atrophy present.     Right Adnexa: not tender, not palpable and no mass present.    Left Adnexa: not tender, not palpable and no mass present.    No cervical motion tenderness or discharge.     Uterus is not enlarged, tender or irregular.  Breasts:    Right: Normal.     Left: Normal.  HENT:     Head: Normocephalic.  Neck:     Thyroid: No thyroid mass, thyromegaly or thyroid tenderness.  Cardiovascular:     Rate and Rhythm: Normal rate and regular rhythm.     Heart sounds: Normal heart sounds, S1 normal and S2 normal.  Pulmonary:     Effort:  Pulmonary effort is normal.     Breath sounds: Normal breath sounds and air entry.  Abdominal:     General: There is no distension.     Palpations: Abdomen is soft. There is no mass.     Tenderness: There is no abdominal tenderness. There is no guarding or rebound.  Musculoskeletal:        General: Normal range of motion.     Cervical  back: Full passive range of motion without pain, normal range of motion and neck supple. No tenderness.     Right lower leg: No edema.     Left lower leg: No edema.  Neurological:     Mental Status: She is alert.  Skin:    General: Skin is warm.  Psychiatric:        Mood and Affect: Mood normal.        Behavior: Behavior normal.        Thought Content: Thought content normal.  Vitals and nursing note reviewed. Exam conducted with a chaperone present.       A:         Well Woman GYN exam H/o CIN1 Fatigue, insomnia, hot flashes                              P:        Pap smear collected today Encouraged annual mammogram screening Colon cancer screening referral placed today DXA ordered today Labs and immunizations ordered today Discussed breast self exams Encouraged healthy lifestyle practices Encouraged Vit D and Calcium  Referrals placed: see HPI above  No follow-ups on file.  Karina Gaines     [1]  Current Outpatient Medications on File Prior to Visit  Medication Sig Dispense Refill   Acetaminophen  (TYLENOL  PO) Take by mouth as needed.     loratadine (CLARITIN) 10 MG tablet Take 10 mg by mouth daily.     Multiple Vitamin (MULTIVITAMIN) tablet Take 1 tablet by mouth daily.     Pseudoephedrine HCl (SUDAFED PO) Take by mouth.     No current facility-administered medications on file prior to visit.  [2]  Allergies Allergen Reactions   Macrobid [Nitrofurantoin Monohyd Macro]     Big itchy red bumps   Sulfa Antibiotics Other (See Comments)    headache   Latex Rash   Tdap [Tetanus-Diphth-Acell Pertussis] Rash    Fever,  chills, muscle aches, HA   "

## 2024-10-15 ENCOUNTER — Ambulatory Visit: Payer: Self-pay | Admitting: Obstetrics and Gynecology

## 2024-10-15 DIAGNOSIS — R7303 Prediabetes: Secondary | ICD-10-CM

## 2024-10-15 DIAGNOSIS — E7800 Pure hypercholesterolemia, unspecified: Secondary | ICD-10-CM

## 2024-10-16 LAB — LIPID PANEL
Cholesterol: 222 mg/dL — ABNORMAL HIGH
HDL: 62 mg/dL
LDL Cholesterol (Calc): 140 mg/dL — ABNORMAL HIGH
Non-HDL Cholesterol (Calc): 160 mg/dL — ABNORMAL HIGH
Total CHOL/HDL Ratio: 3.6 (calc)
Triglycerides: 90 mg/dL

## 2024-10-16 LAB — COMPREHENSIVE METABOLIC PANEL WITH GFR
AG Ratio: 1.7 (calc) (ref 1.0–2.5)
ALT: 12 U/L (ref 6–29)
AST: 16 U/L (ref 10–35)
Albumin: 4.7 g/dL (ref 3.6–5.1)
Alkaline phosphatase (APISO): 58 U/L (ref 37–153)
BUN: 18 mg/dL (ref 7–25)
CO2: 29 mmol/L (ref 20–32)
Calcium: 9.5 mg/dL (ref 8.6–10.4)
Chloride: 103 mmol/L (ref 98–110)
Creat: 0.73 mg/dL (ref 0.50–1.05)
Globulin: 2.8 g/dL (ref 1.9–3.7)
Glucose, Bld: 99 mg/dL (ref 65–99)
Potassium: 4 mmol/L (ref 3.5–5.3)
Sodium: 142 mmol/L (ref 135–146)
Total Bilirubin: 0.6 mg/dL (ref 0.2–1.2)
Total Protein: 7.5 g/dL (ref 6.1–8.1)
eGFR: 92 mL/min/1.73m2

## 2024-10-16 LAB — CYTOLOGY - PAP
Adequacy: ABSENT
Comment: NEGATIVE
Diagnosis: NEGATIVE
High risk HPV: NEGATIVE

## 2024-10-16 LAB — HEMOGLOBIN A1C
Hgb A1c MFr Bld: 5.8 % — ABNORMAL HIGH
Mean Plasma Glucose: 120 mg/dL
eAG (mmol/L): 6.6 mmol/L

## 2024-10-16 LAB — CBC
HCT: 41.7 % (ref 35.9–46.0)
Hemoglobin: 13.6 g/dL (ref 11.7–15.5)
MCH: 32.2 pg (ref 27.0–33.0)
MCHC: 32.6 g/dL (ref 31.6–35.4)
MCV: 98.6 fL (ref 81.4–101.7)
MPV: 9.7 fL (ref 7.5–12.5)
Platelets: 258 Thousand/uL (ref 140–400)
RBC: 4.23 Million/uL (ref 3.80–5.10)
RDW: 12.6 % (ref 11.0–15.0)
WBC: 5.2 Thousand/uL (ref 3.8–10.8)

## 2024-10-16 LAB — APOLIPOPROTEIN EVALUATION
APOLIPOPROTEIN B/A1 RATIO: 0.81 — ABNORMAL HIGH
Apolipoprotein A-1: 155 mg/dL
Apolipoprotein B: 125 mg/dL — ABNORMAL HIGH

## 2024-10-16 LAB — TSH: TSH: 1.82 m[IU]/L (ref 0.40–4.50)

## 2024-10-16 LAB — VITAMIN D 25 HYDROXY (VIT D DEFICIENCY, FRACTURES): Vit D, 25-Hydroxy: 50 ng/mL (ref 30–100)

## 2024-11-16 ENCOUNTER — Other Ambulatory Visit (HOSPITAL_BASED_OUTPATIENT_CLINIC_OR_DEPARTMENT_OTHER): Payer: PRIVATE HEALTH INSURANCE | Admitting: Radiology
